# Patient Record
Sex: Female | Born: 1950 | Race: White | Hispanic: No | Smoking: Former smoker
Health system: Southern US, Community
[De-identification: ages and names within clinical notes are randomized; demographics above are authoritative.]

## PROBLEM LIST (undated history)

## (undated) DIAGNOSIS — R55 Syncope and collapse: Secondary | ICD-10-CM

## (undated) DIAGNOSIS — E785 Hyperlipidemia, unspecified: Secondary | ICD-10-CM

## (undated) DIAGNOSIS — E7800 Pure hypercholesterolemia, unspecified: Secondary | ICD-10-CM

## (undated) DIAGNOSIS — F419 Anxiety disorder, unspecified: Secondary | ICD-10-CM

## (undated) DIAGNOSIS — I10 Essential (primary) hypertension: Secondary | ICD-10-CM

## (undated) DIAGNOSIS — M1990 Unspecified osteoarthritis, unspecified site: Secondary | ICD-10-CM

## (undated) DIAGNOSIS — J189 Pneumonia, unspecified organism: Secondary | ICD-10-CM

## (undated) DIAGNOSIS — F32A Depression, unspecified: Secondary | ICD-10-CM

## (undated) DIAGNOSIS — H539 Unspecified visual disturbance: Secondary | ICD-10-CM

## (undated) DIAGNOSIS — G479 Sleep disorder, unspecified: Secondary | ICD-10-CM

## (undated) HISTORY — PX: RECTOCELE REPAIR: SHX761

## (undated) HISTORY — DX: Sleep disorder, unspecified: G47.9

## (undated) HISTORY — DX: Essential (primary) hypertension: I10

## (undated) HISTORY — PX: OTHER SURGICAL HISTORY: SHX169

## (undated) HISTORY — DX: Hyperlipidemia, unspecified: E78.5

## (undated) HISTORY — DX: Pure hypercholesterolemia, unspecified: E78.00

## (undated) HISTORY — PX: ABDOMINAL HYSTERECTOMY: SHX81

## (undated) HISTORY — PX: BONE EXCISION: SHX6730

## (undated) HISTORY — PX: BREAST BIOPSY: SHX20

## (undated) HISTORY — DX: Unspecified visual disturbance: H53.9

## (undated) HISTORY — DX: Syncope and collapse: R55

---

## 2016-02-03 DIAGNOSIS — Z780 Asymptomatic menopausal state: Secondary | ICD-10-CM | POA: Diagnosis not present

## 2016-02-03 DIAGNOSIS — M8580 Other specified disorders of bone density and structure, unspecified site: Secondary | ICD-10-CM | POA: Diagnosis not present

## 2016-02-03 DIAGNOSIS — Z800 Family history of malignant neoplasm of digestive organs: Secondary | ICD-10-CM | POA: Diagnosis not present

## 2016-02-03 DIAGNOSIS — Z86010 Personal history of colonic polyps: Secondary | ICD-10-CM | POA: Diagnosis not present

## 2016-02-03 DIAGNOSIS — Z23 Encounter for immunization: Secondary | ICD-10-CM | POA: Diagnosis not present

## 2016-02-03 DIAGNOSIS — I10 Essential (primary) hypertension: Secondary | ICD-10-CM | POA: Diagnosis not present

## 2016-04-20 DIAGNOSIS — R300 Dysuria: Secondary | ICD-10-CM | POA: Diagnosis not present

## 2016-06-12 DIAGNOSIS — Z86010 Personal history of colonic polyps: Secondary | ICD-10-CM | POA: Diagnosis not present

## 2016-06-12 DIAGNOSIS — Z6829 Body mass index (BMI) 29.0-29.9, adult: Secondary | ICD-10-CM | POA: Diagnosis not present

## 2016-06-12 DIAGNOSIS — Z23 Encounter for immunization: Secondary | ICD-10-CM | POA: Diagnosis not present

## 2016-06-12 DIAGNOSIS — Z1159 Encounter for screening for other viral diseases: Secondary | ICD-10-CM | POA: Diagnosis not present

## 2016-06-12 DIAGNOSIS — I10 Essential (primary) hypertension: Secondary | ICD-10-CM | POA: Diagnosis not present

## 2016-06-12 DIAGNOSIS — F439 Reaction to severe stress, unspecified: Secondary | ICD-10-CM | POA: Diagnosis not present

## 2016-06-12 DIAGNOSIS — Z800 Family history of malignant neoplasm of digestive organs: Secondary | ICD-10-CM | POA: Diagnosis not present

## 2016-06-12 DIAGNOSIS — Z0000 Encounter for general adult medical examination without abnormal findings: Secondary | ICD-10-CM | POA: Diagnosis not present

## 2016-06-12 DIAGNOSIS — E663 Overweight: Secondary | ICD-10-CM | POA: Diagnosis not present

## 2016-06-12 DIAGNOSIS — Z780 Asymptomatic menopausal state: Secondary | ICD-10-CM | POA: Diagnosis not present

## 2016-06-17 ENCOUNTER — Other Ambulatory Visit: Payer: Self-pay | Admitting: Family Medicine

## 2016-06-17 DIAGNOSIS — Z803 Family history of malignant neoplasm of breast: Secondary | ICD-10-CM

## 2016-06-17 DIAGNOSIS — Z1231 Encounter for screening mammogram for malignant neoplasm of breast: Secondary | ICD-10-CM

## 2016-06-23 DIAGNOSIS — R899 Unspecified abnormal finding in specimens from other organs, systems and tissues: Secondary | ICD-10-CM | POA: Diagnosis not present

## 2016-06-23 DIAGNOSIS — I10 Essential (primary) hypertension: Secondary | ICD-10-CM | POA: Diagnosis not present

## 2016-08-11 ENCOUNTER — Ambulatory Visit: Payer: Self-pay

## 2016-08-21 DIAGNOSIS — J069 Acute upper respiratory infection, unspecified: Secondary | ICD-10-CM | POA: Diagnosis not present

## 2016-08-21 DIAGNOSIS — R05 Cough: Secondary | ICD-10-CM | POA: Diagnosis not present

## 2016-08-31 DIAGNOSIS — J189 Pneumonia, unspecified organism: Secondary | ICD-10-CM | POA: Diagnosis not present

## 2016-09-14 DIAGNOSIS — J189 Pneumonia, unspecified organism: Secondary | ICD-10-CM | POA: Diagnosis not present

## 2016-09-14 DIAGNOSIS — R899 Unspecified abnormal finding in specimens from other organs, systems and tissues: Secondary | ICD-10-CM | POA: Diagnosis not present

## 2016-09-14 DIAGNOSIS — E7800 Pure hypercholesterolemia, unspecified: Secondary | ICD-10-CM | POA: Diagnosis not present

## 2016-09-15 ENCOUNTER — Encounter: Payer: Self-pay | Admitting: Radiology

## 2016-09-15 ENCOUNTER — Ambulatory Visit
Admission: RE | Admit: 2016-09-15 | Discharge: 2016-09-15 | Disposition: A | Discharge status: Home / Self Care | Payer: PRIVATE HEALTH INSURANCE | Source: Ambulatory Visit | Attending: Family Medicine | Admitting: Family Medicine

## 2016-09-15 DIAGNOSIS — Z1231 Encounter for screening mammogram for malignant neoplasm of breast: Secondary | ICD-10-CM

## 2016-09-15 DIAGNOSIS — Z803 Family history of malignant neoplasm of breast: Secondary | ICD-10-CM

## 2016-09-16 ENCOUNTER — Other Ambulatory Visit: Payer: Self-pay | Admitting: Family Medicine

## 2016-09-16 DIAGNOSIS — R928 Other abnormal and inconclusive findings on diagnostic imaging of breast: Secondary | ICD-10-CM

## 2016-09-22 ENCOUNTER — Other Ambulatory Visit: Payer: Self-pay | Admitting: Family Medicine

## 2016-09-22 ENCOUNTER — Ambulatory Visit
Admission: RE | Admit: 2016-09-22 | Discharge: 2016-09-22 | Disposition: A | Discharge status: Home / Self Care | Payer: PRIVATE HEALTH INSURANCE | Source: Ambulatory Visit | Attending: Family Medicine | Admitting: Family Medicine

## 2016-09-22 DIAGNOSIS — R928 Other abnormal and inconclusive findings on diagnostic imaging of breast: Secondary | ICD-10-CM | POA: Diagnosis not present

## 2016-09-22 DIAGNOSIS — N6310 Unspecified lump in the right breast, unspecified quadrant: Secondary | ICD-10-CM | POA: Diagnosis not present

## 2016-09-24 ENCOUNTER — Other Ambulatory Visit: Payer: Self-pay | Admitting: Family Medicine

## 2016-09-24 DIAGNOSIS — R928 Other abnormal and inconclusive findings on diagnostic imaging of breast: Secondary | ICD-10-CM

## 2016-09-25 ENCOUNTER — Ambulatory Visit
Admission: RE | Admit: 2016-09-25 | Discharge: 2016-09-25 | Disposition: A | Discharge status: Home / Self Care | Payer: Medicare Other | Source: Ambulatory Visit | Attending: Family Medicine | Admitting: Family Medicine

## 2016-09-25 DIAGNOSIS — R590 Localized enlarged lymph nodes: Secondary | ICD-10-CM | POA: Diagnosis not present

## 2016-09-25 DIAGNOSIS — R928 Other abnormal and inconclusive findings on diagnostic imaging of breast: Secondary | ICD-10-CM

## 2016-09-25 DIAGNOSIS — N6311 Unspecified lump in the right breast, upper outer quadrant: Secondary | ICD-10-CM | POA: Diagnosis not present

## 2016-09-25 DIAGNOSIS — N6313 Unspecified lump in the right breast, lower outer quadrant: Secondary | ICD-10-CM | POA: Diagnosis not present

## 2016-09-25 DIAGNOSIS — N6310 Unspecified lump in the right breast, unspecified quadrant: Secondary | ICD-10-CM | POA: Diagnosis not present

## 2016-09-25 HISTORY — PX: BREAST BIOPSY: SHX20

## 2016-12-07 DIAGNOSIS — R05 Cough: Secondary | ICD-10-CM | POA: Diagnosis not present

## 2017-03-02 DIAGNOSIS — N951 Menopausal and female climacteric states: Secondary | ICD-10-CM | POA: Diagnosis not present

## 2017-03-02 DIAGNOSIS — R635 Abnormal weight gain: Secondary | ICD-10-CM | POA: Diagnosis not present

## 2017-03-04 DIAGNOSIS — E782 Mixed hyperlipidemia: Secondary | ICD-10-CM | POA: Diagnosis not present

## 2017-03-04 DIAGNOSIS — I10 Essential (primary) hypertension: Secondary | ICD-10-CM | POA: Diagnosis not present

## 2017-03-04 DIAGNOSIS — R5383 Other fatigue: Secondary | ICD-10-CM | POA: Diagnosis not present

## 2017-03-04 DIAGNOSIS — Z6830 Body mass index (BMI) 30.0-30.9, adult: Secondary | ICD-10-CM | POA: Diagnosis not present

## 2017-03-04 DIAGNOSIS — N958 Other specified menopausal and perimenopausal disorders: Secondary | ICD-10-CM | POA: Diagnosis not present

## 2017-03-09 DIAGNOSIS — I10 Essential (primary) hypertension: Secondary | ICD-10-CM | POA: Diagnosis not present

## 2017-03-09 DIAGNOSIS — E782 Mixed hyperlipidemia: Secondary | ICD-10-CM | POA: Diagnosis not present

## 2017-03-09 DIAGNOSIS — Z6830 Body mass index (BMI) 30.0-30.9, adult: Secondary | ICD-10-CM | POA: Diagnosis not present

## 2017-03-16 DIAGNOSIS — I10 Essential (primary) hypertension: Secondary | ICD-10-CM | POA: Diagnosis not present

## 2017-03-16 DIAGNOSIS — Z6830 Body mass index (BMI) 30.0-30.9, adult: Secondary | ICD-10-CM | POA: Diagnosis not present

## 2017-03-16 DIAGNOSIS — E782 Mixed hyperlipidemia: Secondary | ICD-10-CM | POA: Diagnosis not present

## 2017-03-23 DIAGNOSIS — Z6829 Body mass index (BMI) 29.0-29.9, adult: Secondary | ICD-10-CM | POA: Diagnosis not present

## 2017-03-23 DIAGNOSIS — E782 Mixed hyperlipidemia: Secondary | ICD-10-CM | POA: Diagnosis not present

## 2017-03-23 DIAGNOSIS — I10 Essential (primary) hypertension: Secondary | ICD-10-CM | POA: Diagnosis not present

## 2017-03-27 DIAGNOSIS — H2513 Age-related nuclear cataract, bilateral: Secondary | ICD-10-CM | POA: Diagnosis not present

## 2017-04-01 DIAGNOSIS — N958 Other specified menopausal and perimenopausal disorders: Secondary | ICD-10-CM | POA: Diagnosis not present

## 2017-04-01 DIAGNOSIS — I10 Essential (primary) hypertension: Secondary | ICD-10-CM | POA: Diagnosis not present

## 2017-04-01 DIAGNOSIS — Z6828 Body mass index (BMI) 28.0-28.9, adult: Secondary | ICD-10-CM | POA: Diagnosis not present

## 2017-04-01 DIAGNOSIS — E782 Mixed hyperlipidemia: Secondary | ICD-10-CM | POA: Diagnosis not present

## 2017-04-08 DIAGNOSIS — E663 Overweight: Secondary | ICD-10-CM | POA: Diagnosis not present

## 2017-04-08 DIAGNOSIS — Z6828 Body mass index (BMI) 28.0-28.9, adult: Secondary | ICD-10-CM | POA: Diagnosis not present

## 2017-04-29 DIAGNOSIS — I10 Essential (primary) hypertension: Secondary | ICD-10-CM | POA: Diagnosis not present

## 2017-04-29 DIAGNOSIS — E782 Mixed hyperlipidemia: Secondary | ICD-10-CM | POA: Diagnosis not present

## 2017-04-29 DIAGNOSIS — Z6829 Body mass index (BMI) 29.0-29.9, adult: Secondary | ICD-10-CM | POA: Diagnosis not present

## 2017-05-18 DIAGNOSIS — I10 Essential (primary) hypertension: Secondary | ICD-10-CM | POA: Diagnosis not present

## 2017-05-18 DIAGNOSIS — Z6827 Body mass index (BMI) 27.0-27.9, adult: Secondary | ICD-10-CM | POA: Diagnosis not present

## 2017-05-18 DIAGNOSIS — E782 Mixed hyperlipidemia: Secondary | ICD-10-CM | POA: Diagnosis not present

## 2017-05-27 DIAGNOSIS — I10 Essential (primary) hypertension: Secondary | ICD-10-CM | POA: Diagnosis not present

## 2017-05-27 DIAGNOSIS — E782 Mixed hyperlipidemia: Secondary | ICD-10-CM | POA: Diagnosis not present

## 2017-05-27 DIAGNOSIS — Z6827 Body mass index (BMI) 27.0-27.9, adult: Secondary | ICD-10-CM | POA: Diagnosis not present

## 2017-06-15 DIAGNOSIS — M180 Bilateral primary osteoarthritis of first carpometacarpal joints: Secondary | ICD-10-CM | POA: Diagnosis not present

## 2017-06-15 DIAGNOSIS — K146 Glossodynia: Secondary | ICD-10-CM | POA: Diagnosis not present

## 2017-06-15 DIAGNOSIS — Z0000 Encounter for general adult medical examination without abnormal findings: Secondary | ICD-10-CM | POA: Diagnosis not present

## 2017-06-15 DIAGNOSIS — E7800 Pure hypercholesterolemia, unspecified: Secondary | ICD-10-CM | POA: Diagnosis not present

## 2017-06-15 DIAGNOSIS — E663 Overweight: Secondary | ICD-10-CM | POA: Diagnosis not present

## 2017-06-15 DIAGNOSIS — Z86010 Personal history of colonic polyps: Secondary | ICD-10-CM | POA: Diagnosis not present

## 2017-06-15 DIAGNOSIS — Z6827 Body mass index (BMI) 27.0-27.9, adult: Secondary | ICD-10-CM | POA: Diagnosis not present

## 2017-06-15 DIAGNOSIS — I10 Essential (primary) hypertension: Secondary | ICD-10-CM | POA: Diagnosis not present

## 2017-06-15 DIAGNOSIS — Z800 Family history of malignant neoplasm of digestive organs: Secondary | ICD-10-CM | POA: Diagnosis not present

## 2017-06-15 DIAGNOSIS — R5383 Other fatigue: Secondary | ICD-10-CM | POA: Diagnosis not present

## 2017-06-15 DIAGNOSIS — R899 Unspecified abnormal finding in specimens from other organs, systems and tissues: Secondary | ICD-10-CM | POA: Diagnosis not present

## 2017-06-15 DIAGNOSIS — Z1389 Encounter for screening for other disorder: Secondary | ICD-10-CM | POA: Diagnosis not present

## 2017-06-22 DIAGNOSIS — N958 Other specified menopausal and perimenopausal disorders: Secondary | ICD-10-CM | POA: Diagnosis not present

## 2017-06-22 DIAGNOSIS — Z6826 Body mass index (BMI) 26.0-26.9, adult: Secondary | ICD-10-CM | POA: Diagnosis not present

## 2017-06-22 DIAGNOSIS — E782 Mixed hyperlipidemia: Secondary | ICD-10-CM | POA: Diagnosis not present

## 2017-06-22 DIAGNOSIS — I10 Essential (primary) hypertension: Secondary | ICD-10-CM | POA: Diagnosis not present

## 2017-07-09 DIAGNOSIS — M1811 Unilateral primary osteoarthritis of first carpometacarpal joint, right hand: Secondary | ICD-10-CM | POA: Diagnosis not present

## 2017-07-09 DIAGNOSIS — M1812 Unilateral primary osteoarthritis of first carpometacarpal joint, left hand: Secondary | ICD-10-CM | POA: Diagnosis not present

## 2017-08-02 DIAGNOSIS — M1812 Unilateral primary osteoarthritis of first carpometacarpal joint, left hand: Secondary | ICD-10-CM | POA: Diagnosis not present

## 2017-08-02 DIAGNOSIS — G8918 Other acute postprocedural pain: Secondary | ICD-10-CM | POA: Diagnosis not present

## 2017-08-16 ENCOUNTER — Other Ambulatory Visit: Payer: Self-pay | Admitting: Family Medicine

## 2017-08-16 DIAGNOSIS — Z1231 Encounter for screening mammogram for malignant neoplasm of breast: Secondary | ICD-10-CM

## 2017-08-17 DIAGNOSIS — M1812 Unilateral primary osteoarthritis of first carpometacarpal joint, left hand: Secondary | ICD-10-CM | POA: Diagnosis not present

## 2017-08-27 DIAGNOSIS — M1812 Unilateral primary osteoarthritis of first carpometacarpal joint, left hand: Secondary | ICD-10-CM | POA: Diagnosis not present

## 2017-09-14 DIAGNOSIS — M1812 Unilateral primary osteoarthritis of first carpometacarpal joint, left hand: Secondary | ICD-10-CM | POA: Diagnosis not present

## 2017-09-17 DIAGNOSIS — M1812 Unilateral primary osteoarthritis of first carpometacarpal joint, left hand: Secondary | ICD-10-CM | POA: Diagnosis not present

## 2017-09-22 DIAGNOSIS — M1812 Unilateral primary osteoarthritis of first carpometacarpal joint, left hand: Secondary | ICD-10-CM | POA: Diagnosis not present

## 2017-09-28 ENCOUNTER — Ambulatory Visit
Admission: RE | Admit: 2017-09-28 | Discharge: 2017-09-28 | Disposition: A | Discharge status: Home / Self Care | Payer: Medicare Other | Source: Ambulatory Visit | Attending: Family Medicine | Admitting: Family Medicine

## 2017-09-28 DIAGNOSIS — Z1231 Encounter for screening mammogram for malignant neoplasm of breast: Secondary | ICD-10-CM | POA: Diagnosis not present

## 2017-09-29 DIAGNOSIS — M1812 Unilateral primary osteoarthritis of first carpometacarpal joint, left hand: Secondary | ICD-10-CM | POA: Diagnosis not present

## 2017-10-01 DIAGNOSIS — M1812 Unilateral primary osteoarthritis of first carpometacarpal joint, left hand: Secondary | ICD-10-CM | POA: Diagnosis not present

## 2017-10-06 DIAGNOSIS — M1812 Unilateral primary osteoarthritis of first carpometacarpal joint, left hand: Secondary | ICD-10-CM | POA: Diagnosis not present

## 2017-10-08 DIAGNOSIS — M1812 Unilateral primary osteoarthritis of first carpometacarpal joint, left hand: Secondary | ICD-10-CM | POA: Diagnosis not present

## 2017-10-11 DIAGNOSIS — M1812 Unilateral primary osteoarthritis of first carpometacarpal joint, left hand: Secondary | ICD-10-CM | POA: Diagnosis not present

## 2017-10-12 DIAGNOSIS — R55 Syncope and collapse: Secondary | ICD-10-CM

## 2017-10-12 HISTORY — DX: Syncope and collapse: R55

## 2017-10-13 DIAGNOSIS — M79645 Pain in left finger(s): Secondary | ICD-10-CM | POA: Diagnosis not present

## 2017-10-19 DIAGNOSIS — M79645 Pain in left finger(s): Secondary | ICD-10-CM | POA: Diagnosis not present

## 2017-10-21 DIAGNOSIS — M79645 Pain in left finger(s): Secondary | ICD-10-CM | POA: Diagnosis not present

## 2017-10-26 DIAGNOSIS — N951 Menopausal and female climacteric states: Secondary | ICD-10-CM | POA: Diagnosis not present

## 2017-10-26 DIAGNOSIS — R350 Frequency of micturition: Secondary | ICD-10-CM | POA: Diagnosis not present

## 2017-10-26 DIAGNOSIS — Z4789 Encounter for other orthopedic aftercare: Secondary | ICD-10-CM | POA: Diagnosis not present

## 2017-10-26 DIAGNOSIS — M13842 Other specified arthritis, left hand: Secondary | ICD-10-CM | POA: Diagnosis not present

## 2017-10-26 DIAGNOSIS — M79645 Pain in left finger(s): Secondary | ICD-10-CM | POA: Diagnosis not present

## 2017-10-28 DIAGNOSIS — M79645 Pain in left finger(s): Secondary | ICD-10-CM | POA: Diagnosis not present

## 2017-11-02 DIAGNOSIS — M79645 Pain in left finger(s): Secondary | ICD-10-CM | POA: Diagnosis not present

## 2017-11-04 DIAGNOSIS — M79645 Pain in left finger(s): Secondary | ICD-10-CM | POA: Diagnosis not present

## 2017-11-17 DIAGNOSIS — N951 Menopausal and female climacteric states: Secondary | ICD-10-CM | POA: Diagnosis not present

## 2018-02-01 DIAGNOSIS — M13842 Other specified arthritis, left hand: Secondary | ICD-10-CM | POA: Diagnosis not present

## 2018-02-01 DIAGNOSIS — M13841 Other specified arthritis, right hand: Secondary | ICD-10-CM | POA: Diagnosis not present

## 2018-02-01 DIAGNOSIS — M79645 Pain in left finger(s): Secondary | ICD-10-CM | POA: Diagnosis not present

## 2018-04-04 DIAGNOSIS — G8918 Other acute postprocedural pain: Secondary | ICD-10-CM | POA: Diagnosis not present

## 2018-04-04 DIAGNOSIS — M1811 Unilateral primary osteoarthritis of first carpometacarpal joint, right hand: Secondary | ICD-10-CM | POA: Diagnosis not present

## 2018-04-18 DIAGNOSIS — Z4789 Encounter for other orthopedic aftercare: Secondary | ICD-10-CM | POA: Diagnosis not present

## 2018-04-18 DIAGNOSIS — M13841 Other specified arthritis, right hand: Secondary | ICD-10-CM | POA: Diagnosis not present

## 2018-05-04 DIAGNOSIS — H2513 Age-related nuclear cataract, bilateral: Secondary | ICD-10-CM | POA: Diagnosis not present

## 2018-05-13 DIAGNOSIS — N390 Urinary tract infection, site not specified: Secondary | ICD-10-CM | POA: Diagnosis not present

## 2018-05-19 DIAGNOSIS — Z4789 Encounter for other orthopedic aftercare: Secondary | ICD-10-CM | POA: Diagnosis not present

## 2018-05-19 DIAGNOSIS — M13841 Other specified arthritis, right hand: Secondary | ICD-10-CM | POA: Diagnosis not present

## 2018-06-02 DIAGNOSIS — M79644 Pain in right finger(s): Secondary | ICD-10-CM | POA: Diagnosis not present

## 2018-06-09 DIAGNOSIS — M79644 Pain in right finger(s): Secondary | ICD-10-CM | POA: Diagnosis not present

## 2018-06-16 DIAGNOSIS — M13841 Other specified arthritis, right hand: Secondary | ICD-10-CM | POA: Diagnosis not present

## 2018-06-16 DIAGNOSIS — Z4789 Encounter for other orthopedic aftercare: Secondary | ICD-10-CM | POA: Diagnosis not present

## 2018-06-21 DIAGNOSIS — M79644 Pain in right finger(s): Secondary | ICD-10-CM | POA: Diagnosis not present

## 2018-06-28 ENCOUNTER — Other Ambulatory Visit: Payer: Self-pay | Admitting: Family Medicine

## 2018-06-28 DIAGNOSIS — E7800 Pure hypercholesterolemia, unspecified: Secondary | ICD-10-CM | POA: Diagnosis not present

## 2018-06-28 DIAGNOSIS — J302 Other seasonal allergic rhinitis: Secondary | ICD-10-CM | POA: Diagnosis not present

## 2018-06-28 DIAGNOSIS — I10 Essential (primary) hypertension: Secondary | ICD-10-CM | POA: Diagnosis not present

## 2018-06-28 DIAGNOSIS — Z800 Family history of malignant neoplasm of digestive organs: Secondary | ICD-10-CM | POA: Diagnosis not present

## 2018-06-28 DIAGNOSIS — N951 Menopausal and female climacteric states: Secondary | ICD-10-CM | POA: Diagnosis not present

## 2018-06-28 DIAGNOSIS — Z1231 Encounter for screening mammogram for malignant neoplasm of breast: Secondary | ICD-10-CM

## 2018-06-28 DIAGNOSIS — Z780 Asymptomatic menopausal state: Secondary | ICD-10-CM | POA: Diagnosis not present

## 2018-06-28 DIAGNOSIS — Z1283 Encounter for screening for malignant neoplasm of skin: Secondary | ICD-10-CM | POA: Diagnosis not present

## 2018-06-28 DIAGNOSIS — F439 Reaction to severe stress, unspecified: Secondary | ICD-10-CM | POA: Diagnosis not present

## 2018-06-28 DIAGNOSIS — Z0000 Encounter for general adult medical examination without abnormal findings: Secondary | ICD-10-CM | POA: Diagnosis not present

## 2018-07-07 DIAGNOSIS — M79644 Pain in right finger(s): Secondary | ICD-10-CM | POA: Diagnosis not present

## 2018-07-18 DIAGNOSIS — M21622 Bunionette of left foot: Secondary | ICD-10-CM | POA: Diagnosis not present

## 2018-07-18 DIAGNOSIS — M79671 Pain in right foot: Secondary | ICD-10-CM | POA: Diagnosis not present

## 2018-07-18 DIAGNOSIS — M79672 Pain in left foot: Secondary | ICD-10-CM | POA: Diagnosis not present

## 2018-07-18 DIAGNOSIS — M21621 Bunionette of right foot: Secondary | ICD-10-CM | POA: Diagnosis not present

## 2018-07-28 ENCOUNTER — Other Ambulatory Visit: Payer: Self-pay | Admitting: Family Medicine

## 2018-07-28 DIAGNOSIS — M8580 Other specified disorders of bone density and structure, unspecified site: Secondary | ICD-10-CM

## 2018-08-12 ENCOUNTER — Ambulatory Visit
Admission: RE | Admit: 2018-08-12 | Discharge: 2018-08-12 | Disposition: A | Discharge status: Home / Self Care | Payer: Medicare Other | Source: Ambulatory Visit | Attending: Family Medicine | Admitting: Family Medicine

## 2018-08-12 DIAGNOSIS — M85851 Other specified disorders of bone density and structure, right thigh: Secondary | ICD-10-CM | POA: Diagnosis not present

## 2018-08-12 DIAGNOSIS — M8580 Other specified disorders of bone density and structure, unspecified site: Secondary | ICD-10-CM

## 2018-08-12 DIAGNOSIS — Z780 Asymptomatic menopausal state: Secondary | ICD-10-CM | POA: Diagnosis not present

## 2018-08-25 DIAGNOSIS — I10 Essential (primary) hypertension: Secondary | ICD-10-CM | POA: Diagnosis not present

## 2018-08-25 DIAGNOSIS — R42 Dizziness and giddiness: Secondary | ICD-10-CM | POA: Diagnosis not present

## 2018-08-31 DIAGNOSIS — R9431 Abnormal electrocardiogram [ECG] [EKG]: Secondary | ICD-10-CM | POA: Diagnosis not present

## 2018-08-31 DIAGNOSIS — G479 Sleep disorder, unspecified: Secondary | ICD-10-CM | POA: Diagnosis not present

## 2018-08-31 DIAGNOSIS — I10 Essential (primary) hypertension: Secondary | ICD-10-CM | POA: Diagnosis not present

## 2018-08-31 DIAGNOSIS — R55 Syncope and collapse: Secondary | ICD-10-CM | POA: Diagnosis not present

## 2018-09-15 DIAGNOSIS — H6692 Otitis media, unspecified, left ear: Secondary | ICD-10-CM | POA: Diagnosis not present

## 2018-09-27 DIAGNOSIS — R42 Dizziness and giddiness: Secondary | ICD-10-CM | POA: Diagnosis not present

## 2018-09-27 DIAGNOSIS — R55 Syncope and collapse: Secondary | ICD-10-CM | POA: Diagnosis not present

## 2018-09-27 DIAGNOSIS — R0989 Other specified symptoms and signs involving the circulatory and respiratory systems: Secondary | ICD-10-CM | POA: Diagnosis not present

## 2018-09-27 DIAGNOSIS — I10 Essential (primary) hypertension: Secondary | ICD-10-CM | POA: Diagnosis not present

## 2018-09-30 ENCOUNTER — Ambulatory Visit
Admission: RE | Admit: 2018-09-30 | Discharge: 2018-09-30 | Disposition: A | Discharge status: Home / Self Care | Payer: Medicare Other | Source: Ambulatory Visit | Attending: Family Medicine | Admitting: Family Medicine

## 2018-09-30 DIAGNOSIS — Z1231 Encounter for screening mammogram for malignant neoplasm of breast: Secondary | ICD-10-CM | POA: Diagnosis not present

## 2018-10-03 DIAGNOSIS — R42 Dizziness and giddiness: Secondary | ICD-10-CM | POA: Diagnosis not present

## 2018-10-03 DIAGNOSIS — R55 Syncope and collapse: Secondary | ICD-10-CM | POA: Diagnosis not present

## 2018-10-03 DIAGNOSIS — I10 Essential (primary) hypertension: Secondary | ICD-10-CM | POA: Diagnosis not present

## 2018-10-06 DIAGNOSIS — G479 Sleep disorder, unspecified: Secondary | ICD-10-CM | POA: Diagnosis not present

## 2018-10-06 DIAGNOSIS — I10 Essential (primary) hypertension: Secondary | ICD-10-CM | POA: Diagnosis not present

## 2018-10-06 DIAGNOSIS — R55 Syncope and collapse: Secondary | ICD-10-CM | POA: Diagnosis not present

## 2018-10-06 DIAGNOSIS — R9431 Abnormal electrocardiogram [ECG] [EKG]: Secondary | ICD-10-CM | POA: Diagnosis not present

## 2018-11-21 DIAGNOSIS — E7800 Pure hypercholesterolemia, unspecified: Secondary | ICD-10-CM | POA: Diagnosis not present

## 2018-11-21 DIAGNOSIS — N951 Menopausal and female climacteric states: Secondary | ICD-10-CM | POA: Diagnosis not present

## 2018-11-21 DIAGNOSIS — I10 Essential (primary) hypertension: Secondary | ICD-10-CM | POA: Diagnosis not present

## 2018-11-21 DIAGNOSIS — Z6832 Body mass index (BMI) 32.0-32.9, adult: Secondary | ICD-10-CM | POA: Diagnosis not present

## 2018-12-06 DIAGNOSIS — L821 Other seborrheic keratosis: Secondary | ICD-10-CM | POA: Diagnosis not present

## 2018-12-06 DIAGNOSIS — D2261 Melanocytic nevi of right upper limb, including shoulder: Secondary | ICD-10-CM | POA: Diagnosis not present

## 2018-12-06 DIAGNOSIS — L570 Actinic keratosis: Secondary | ICD-10-CM | POA: Diagnosis not present

## 2018-12-06 DIAGNOSIS — D225 Melanocytic nevi of trunk: Secondary | ICD-10-CM | POA: Diagnosis not present

## 2018-12-06 DIAGNOSIS — D2239 Melanocytic nevi of other parts of face: Secondary | ICD-10-CM | POA: Diagnosis not present

## 2018-12-06 DIAGNOSIS — D2262 Melanocytic nevi of left upper limb, including shoulder: Secondary | ICD-10-CM | POA: Diagnosis not present

## 2018-12-06 DIAGNOSIS — L720 Epidermal cyst: Secondary | ICD-10-CM | POA: Diagnosis not present

## 2018-12-08 ENCOUNTER — Encounter: Payer: Self-pay | Admitting: Neurology

## 2018-12-12 ENCOUNTER — Other Ambulatory Visit: Payer: Self-pay

## 2018-12-12 ENCOUNTER — Ambulatory Visit (INDEPENDENT_AMBULATORY_CARE_PROVIDER_SITE_OTHER): Payer: PPO | Admitting: Neurology

## 2018-12-12 ENCOUNTER — Encounter: Payer: Self-pay | Admitting: Neurology

## 2018-12-12 VITALS — BP 136/81 | HR 94 | Resp 16 | Ht 63.0 in | Wt 180.0 lb

## 2018-12-12 DIAGNOSIS — G478 Other sleep disorders: Secondary | ICD-10-CM

## 2018-12-12 DIAGNOSIS — R0683 Snoring: Secondary | ICD-10-CM

## 2018-12-12 DIAGNOSIS — R351 Nocturia: Secondary | ICD-10-CM | POA: Diagnosis not present

## 2018-12-12 DIAGNOSIS — N951 Menopausal and female climacteric states: Secondary | ICD-10-CM | POA: Diagnosis not present

## 2018-12-12 DIAGNOSIS — G4719 Other hypersomnia: Secondary | ICD-10-CM | POA: Diagnosis not present

## 2018-12-12 NOTE — Progress Notes (Addendum)
SLEEP MEDICINE CLINIC   Provider:  Larey Seat, MD   Primary Care Physician:  Kathyrn Lass, MD   Referring Provider: Dr Einar Gip, MD   Chief Complaint  Patient presents with  . Sleep Disturbance    Rm. 10. Kristy Henry is here to discuss a sleep disturbance. 36lb wt. gain since October 2019. Goes to bed at 10pm, tosses and turns--sometimes it takes hours to fall asleep. Up mult. times during the night to go to the bathroom. Usually falls back to sleep easily. Wakes between 6am-6:30am. C/O EDS. Usually naps around noon for around an hour./fim    HPI:   Kristy Henry is a 68 y.o. female patient of Dr. Irven Shelling. She is,seen on 12-12-2018 upon referral by her cardiologist.  The patient is a 68 year old Caucasian right-handed female that has seen Dr. Einar Gip in follow-up for dizziness.  She is treated for hypertension which is well controlled on 12.5 mg of HCTZ , and she had an episode of fainting in late November 2019.  She states she woke up in the middle of the night had severe back pain and try to get some ibuprofen and drink some water but on the way to the bathroom felt faint finally seemed to have passed out and fell to the ground.  There were no injuries related to the fall she thinks that she was out after her head hit the floor and she can remember some parents of the beginning of her fainting spell.  She also complains now about the sudden onset of uncontrollable need for sleep and irresistible urge to nap around 11 AM.  This has been going on already prior to the syncope and fall.  She has not had any further syncope.  She also complains of menopausal symptoms getting sweats feeling hot in the middle of the night.  When she originally went through menopause she was placed on estradiol which controlled his symptoms very well, but now her PCP had recommended against it due to age, increased risk of blood clots, breast cancer risk etc. she was changed from estradiol to a generic form of Effexor and  she now has menopausal symptoms again.    Chief complaint according to patient :  Sleep habits are as follows: Dinnertime is usually without alcohol, at 5 PM-bedtime is at 10 PM, the bedroom is cool, quiet and dark. She wears a sleep mask, her cat is not in the bedroom during the night. She stops the night on her back, in supine sleep position.  She will turn however during the night.  Uses 1 pillow- memory foam. She wakes every night at different times, often with hot flushes.  nocturia 3 times.  She is usually up spontaneously waking person and by 6.20 she is ready to start today.  This means that she gets on average between 6 and 7 hours of sleep unless woken up more than once.  Medical history:  Bladder tuck, 3 pregnancies- HTN, syncope, hit her head.  Family history: sister with breast cancer.   Social history: Widowed, retired at 22. Her  oldest of 3 sons is 20, 77 and 61 years old, lost her husband suddenly when he was 71, and she was 60. 3 grandchildren. Former ( remote) smoker, non ETOH, no longer consuming alcohol.  Lives alone, caffeine - coffee 2 cups every morning.  Limited income.   Review of Systems: Out of a complete 14 system review, the patient complains of only the following symptoms, and all other reviewed systems  are negative.  Snoring, nocturia 3 times.   Epworth Sleepiness score: 14/ 24 point  , Fatigue severity score:  ,28/ 63 point   Depression score: 3/ 15 points.   Kristy Henry underwent several testings with her cardiologist syncopal and collapse have been worked up in detail, she had normal hemodynamic responses on a treadmill exercise test by Bruce protocol.  Overall quality of the study was good, a stress test and resting SPECT images demonstrated homogenous tracer distribution normal myocardial thickening with normal wall motion.  Left ventricular ejection fraction was calculated 46% but visually appeared normal.  Primary sleep disturbance this  work-up will take place here, essential hypertension with echocardiogram from 09-27-18 normal left ventricular cavity size, moderate concentric hypertrophy of the left ventricle calculated EF now 64% much more more normal looking.  Trace pulmonic regurgitation trace of mitral regurgitation.  Carotid artery duplex from 09/27/2018 minimal stenosis of the right internal carotid artery no stenosis on the left, antegrade left vertebral flow.   Normal sinus rhythm on EKG at  77 bpm, normal axis on 31 August 2018.  The patient also had frequent PVCs.   Social History   Socioeconomic History  . Marital status: Widowed    Spouse name: Not on file  . Number of children: 3  . Years of education: Not on file  . Highest education level: High school graduate  Occupational History  . Occupation: Retired Research scientist (physical sciences)    Comment: [employer]  . Financial resource strain: Not on file  . Food insecurity:    Worry: Not on file    Inability: Not on file  . Transportation needs:    Medical: Not on file    Non-medical: Not on file  Tobacco Use  . Smoking status: Former Research scientist (life sciences)  . Smokeless tobacco: Never Used  Substance and Sexual Activity  . Alcohol use: Yes    Comment: rare glass of wine  . Drug use: Never  . Sexual activity: Not on file  Lifestyle  . Physical activity:    Days per week: Not on file    Minutes per session: Not on file  . Stress: Not on file  Relationships  . Social connections:    Talks on phone: Not on file    Gets together: Not on file    Attends religious service: Not on file    Active member of club or organization: Not on file    Attends meetings of clubs or organizations: Not on file    Relationship status: Not on file  . Intimate partner violence:    Fear of current or ex partner: Not on file    Emotionally abused: Not on file    Physically abused: Not on file    Forced sexual activity: Not on file  Other Topics Concern  . Not on file  Social  History Narrative  . Not on file    Family History  Problem Relation Age of Onset  . Breast cancer Sister   . Heart disease Mother   . Cancer Father   . Colon cancer Father   . Diabetes type II Brother   . High blood pressure Sister   . Diabetes type II Brother     Past Medical History:  Diagnosis Date  . High cholesterol   . HLD (hyperlipidemia)   . Hypertension   . Primary sleep disturbance   . Syncope and collapse   . Vision abnormalities     Past Surgical History:  Procedure Laterality Date  . ABDOMINAL HYSTERECTOMY    . BONE EXCISION     from bilateral hands  . BREAST BIOPSY    . eye lid surgery    . RECTOCELE REPAIR      Current Outpatient Medications  Medication Sig Dispense Refill  . atorvastatin (LIPITOR) 10 MG tablet atorvastatin 10 mg tablet    . Collagen Hydrolysate POWD by Does not apply route.    . Ferrous Sulfate (IRON) 325 (65 Fe) MG TABS Take 1 tablet by mouth 2 (two) times a week.    . hydrochlorothiazide (MICROZIDE) 12.5 MG capsule Take 12.5 mg by mouth daily.    . Turmeric 500 MG CAPS Take by mouth.    . venlafaxine XR (EFFEXOR-XR) 75 MG 24 hr capsule venlafaxine ER 75 mg capsule,extended release 24 hr     No current facility-administered medications for this visit.     Allergies as of 12/12/2018 - Review Complete 12/12/2018  Allergen Reaction Noted  . Hydrocodone  12/08/2018  . Oxycodone  12/08/2018    Vitals: BP 136/81   Pulse 94   Resp 16   Ht 5\' 3"  (1.6 m)   Wt 180 lb (81.6 kg)   BMI 31.89 kg/m  Last Weight:  Wt Readings from Last 1 Encounters:  12/12/18 180 lb (81.6 kg)   XNA:TFTD mass index is 31.89 kg/m.     Last Height:   Ht Readings from Last 1 Encounters:  12/12/18 5\' 3"  (1.6 m)    Physical exam:  General: The patient is awake, alert and appears not in acute distress. The patient is well groomed. Head: Normocephalic, atraumatic. Neck is supple. Mallampati 2 neck circumference: 15".  Nasal airflow patent-   Retrognathia is not seen.  Cardiovascular:  Regular rate and rhythm, without  murmurs or carotid bruit, and without distended neck veins. Respiratory: Lungs are clear to auscultation. Skin:  Without evidence of edema, or rash Trunk: BMI is 32. The patient's posture is erect.  Neurologic exam : The patient is awake and alert, oriented to place and time.   Attention span & concentration ability appears normal.  Speech is fluent,  without dysarthria, dysphonia or aphasia.  Mood and affect are appropriate.  Cranial nerves: Pupils are equal and briskly reactive to light. Funduscopic exam without evidence of pallor or edema. Extraocular movements  in vertical and horizontal planes intact and without nystagmus. Visual fields by finger perimetry are intact. Hearing to finger rub intact.   Facial sensation intact to fine touch.  Facial motor strength is symmetric and tongue and uvula move midline. Shoulder shrug was symmetrical.  Motor exam: Normal tone, muscle bulk and symmetric strength in all extremities. Sensory:  Fine touch, pinprick and vibration were tested in all extremities. Proprioception tested in the upper extremities was normal. Coordination: Rapid alternating movements in the fingers/hands was normal. Finger-to-nose maneuver  normal without evidence of ataxia, dysmetria or tremor. Gait and station: Patient walks without assistive device. Strength within normal limits. Stance is stable and normal.   Deep tendon reflexes: in the  upper and lower extremities are symmetric and intact.    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   2)  EDS-She has an irresistible urge to sleep at 11 AM and 3 Pm . She also wakes with a dry, burning mouth.   3) ear pain- otitis media in November/ December  2019.      The patient was advised of the nature of the diagnosed disorder , the treatment options and the  risks for general health and wellness arising from not treating the condition.   I spent more than 45 minutes of face to face time with the patient.  Greater than 50% of time was spent in counseling and coordination of care. We have discussed the diagnosis and differential and I answered the patient's questions.    Plan:  Treatment plan and additional workup :  The patient should undergo an attended sleep study- I need to rule out OSA, see if she has tachycardia with hot flashes, or hypoxemia.  If negative for all of the sleep apnea types, will consider change from Effexor to Zoloft.  Urged to discuss HST with Dr. Sabra Heck.    Larey Seat, MD 12/15/6859, 68:37 AM  Certified in Neurology by [employer]BP[employer] Certified in [place] by [facility] [address] [place],  [address]

## 2018-12-13 ENCOUNTER — Other Ambulatory Visit: Payer: Self-pay | Admitting: Neurology

## 2018-12-13 ENCOUNTER — Telehealth: Payer: Self-pay

## 2018-12-13 DIAGNOSIS — R0683 Snoring: Secondary | ICD-10-CM

## 2018-12-13 DIAGNOSIS — N951 Menopausal and female climacteric states: Secondary | ICD-10-CM

## 2018-12-13 DIAGNOSIS — R351 Nocturia: Secondary | ICD-10-CM

## 2018-12-13 DIAGNOSIS — G4719 Other hypersomnia: Secondary | ICD-10-CM

## 2018-12-13 DIAGNOSIS — G478 Other sleep disorders: Secondary | ICD-10-CM

## 2018-12-13 NOTE — Telephone Encounter (Signed)
Health team advantage denied in lab sleep study. Need HST order please.

## 2018-12-13 NOTE — Telephone Encounter (Signed)
Order placed

## 2018-12-16 ENCOUNTER — Other Ambulatory Visit: Payer: Self-pay

## 2018-12-16 ENCOUNTER — Telehealth: Payer: Self-pay

## 2018-12-16 MED ORDER — CLONIDINE HCL 0.2 MG PO TABS: 0.2000 mg | ORAL_TABLET | Freq: Two times a day (BID) | ORAL | 1 refills | Status: DC

## 2019-01-06 DIAGNOSIS — H1013 Acute atopic conjunctivitis, bilateral: Secondary | ICD-10-CM | POA: Diagnosis not present

## 2019-01-09 NOTE — Telephone Encounter (Signed)
error 

## 2019-03-20 ENCOUNTER — Telehealth: Payer: Self-pay

## 2019-03-20 NOTE — Telephone Encounter (Signed)
Pt has decided not to schedule HST now. Pt has made changes that MD recommended and pt is sleeping so much better now. Informed pt to call us back if anything changes.

## 2019-06-07 DIAGNOSIS — S70261A Insect bite (nonvenomous), right hip, initial encounter: Secondary | ICD-10-CM | POA: Diagnosis not present

## 2019-07-11 ENCOUNTER — Other Ambulatory Visit (HOSPITAL_COMMUNITY): Payer: Self-pay | Admitting: Family Medicine

## 2019-07-11 DIAGNOSIS — Z800 Family history of malignant neoplasm of digestive organs: Secondary | ICD-10-CM | POA: Diagnosis not present

## 2019-07-11 DIAGNOSIS — Z0000 Encounter for general adult medical examination without abnormal findings: Secondary | ICD-10-CM | POA: Diagnosis not present

## 2019-07-11 DIAGNOSIS — J302 Other seasonal allergic rhinitis: Secondary | ICD-10-CM | POA: Diagnosis not present

## 2019-07-11 DIAGNOSIS — R0989 Other specified symptoms and signs involving the circulatory and respiratory systems: Secondary | ICD-10-CM

## 2019-07-11 DIAGNOSIS — E663 Overweight: Secondary | ICD-10-CM | POA: Diagnosis not present

## 2019-07-11 DIAGNOSIS — Z86010 Personal history of colonic polyps: Secondary | ICD-10-CM | POA: Diagnosis not present

## 2019-07-11 DIAGNOSIS — Z6832 Body mass index (BMI) 32.0-32.9, adult: Secondary | ICD-10-CM | POA: Diagnosis not present

## 2019-07-11 DIAGNOSIS — E7800 Pure hypercholesterolemia, unspecified: Secondary | ICD-10-CM | POA: Diagnosis not present

## 2019-07-11 DIAGNOSIS — I10 Essential (primary) hypertension: Secondary | ICD-10-CM | POA: Diagnosis not present

## 2019-07-17 ENCOUNTER — Other Ambulatory Visit: Payer: Self-pay

## 2019-07-17 ENCOUNTER — Ambulatory Visit (HOSPITAL_COMMUNITY)
Admission: RE | Admit: 2019-07-17 | Discharge: 2019-07-17 | Disposition: A | Discharge status: Home / Self Care | Payer: PPO | Source: Ambulatory Visit | Attending: Family Medicine | Admitting: Family Medicine

## 2019-07-17 DIAGNOSIS — R0989 Other specified symptoms and signs involving the circulatory and respiratory systems: Secondary | ICD-10-CM | POA: Diagnosis not present

## 2019-08-28 ENCOUNTER — Other Ambulatory Visit: Payer: Self-pay | Admitting: Family Medicine

## 2019-08-28 DIAGNOSIS — Z1231 Encounter for screening mammogram for malignant neoplasm of breast: Secondary | ICD-10-CM

## 2019-09-11 DIAGNOSIS — B029 Zoster without complications: Secondary | ICD-10-CM | POA: Diagnosis not present

## 2019-10-23 DIAGNOSIS — M180 Bilateral primary osteoarthritis of first carpometacarpal joints: Secondary | ICD-10-CM | POA: Diagnosis not present

## 2019-10-23 DIAGNOSIS — E7800 Pure hypercholesterolemia, unspecified: Secondary | ICD-10-CM | POA: Diagnosis not present

## 2019-10-23 DIAGNOSIS — I10 Essential (primary) hypertension: Secondary | ICD-10-CM | POA: Diagnosis not present

## 2019-10-24 ENCOUNTER — Ambulatory Visit
Admission: RE | Admit: 2019-10-24 | Discharge: 2019-10-24 | Disposition: A | Discharge status: Home / Self Care | Payer: PPO | Source: Ambulatory Visit | Attending: Family Medicine | Admitting: Family Medicine

## 2019-10-24 ENCOUNTER — Other Ambulatory Visit: Payer: Self-pay

## 2019-10-24 DIAGNOSIS — Z1231 Encounter for screening mammogram for malignant neoplasm of breast: Secondary | ICD-10-CM | POA: Diagnosis not present

## 2019-11-21 DIAGNOSIS — E7800 Pure hypercholesterolemia, unspecified: Secondary | ICD-10-CM | POA: Diagnosis not present

## 2019-11-21 DIAGNOSIS — R42 Dizziness and giddiness: Secondary | ICD-10-CM | POA: Diagnosis not present

## 2019-11-21 DIAGNOSIS — I10 Essential (primary) hypertension: Secondary | ICD-10-CM | POA: Diagnosis not present

## 2019-12-05 DIAGNOSIS — R42 Dizziness and giddiness: Secondary | ICD-10-CM | POA: Diagnosis not present

## 2019-12-05 DIAGNOSIS — R17 Unspecified jaundice: Secondary | ICD-10-CM | POA: Diagnosis not present

## 2019-12-05 DIAGNOSIS — I10 Essential (primary) hypertension: Secondary | ICD-10-CM | POA: Diagnosis not present

## 2019-12-08 DIAGNOSIS — R3989 Other symptoms and signs involving the genitourinary system: Secondary | ICD-10-CM | POA: Diagnosis not present

## 2019-12-19 DIAGNOSIS — I10 Essential (primary) hypertension: Secondary | ICD-10-CM | POA: Diagnosis not present

## 2019-12-19 DIAGNOSIS — R17 Unspecified jaundice: Secondary | ICD-10-CM | POA: Diagnosis not present

## 2020-02-21 DIAGNOSIS — E559 Vitamin D deficiency, unspecified: Secondary | ICD-10-CM | POA: Diagnosis not present

## 2020-02-28 DIAGNOSIS — I10 Essential (primary) hypertension: Secondary | ICD-10-CM | POA: Diagnosis not present

## 2020-02-28 DIAGNOSIS — M533 Sacrococcygeal disorders, not elsewhere classified: Secondary | ICD-10-CM | POA: Diagnosis not present

## 2020-02-28 DIAGNOSIS — Z6831 Body mass index (BMI) 31.0-31.9, adult: Secondary | ICD-10-CM | POA: Diagnosis not present

## 2020-04-02 DIAGNOSIS — E7800 Pure hypercholesterolemia, unspecified: Secondary | ICD-10-CM | POA: Diagnosis not present

## 2020-04-02 DIAGNOSIS — M180 Bilateral primary osteoarthritis of first carpometacarpal joints: Secondary | ICD-10-CM | POA: Diagnosis not present

## 2020-04-02 DIAGNOSIS — I10 Essential (primary) hypertension: Secondary | ICD-10-CM | POA: Diagnosis not present

## 2020-04-22 DIAGNOSIS — I10 Essential (primary) hypertension: Secondary | ICD-10-CM | POA: Diagnosis not present

## 2020-04-22 DIAGNOSIS — E7800 Pure hypercholesterolemia, unspecified: Secondary | ICD-10-CM | POA: Diagnosis not present

## 2020-04-22 DIAGNOSIS — M180 Bilateral primary osteoarthritis of first carpometacarpal joints: Secondary | ICD-10-CM | POA: Diagnosis not present

## 2020-07-16 DIAGNOSIS — M8580 Other specified disorders of bone density and structure, unspecified site: Secondary | ICD-10-CM | POA: Diagnosis not present

## 2020-07-16 DIAGNOSIS — I10 Essential (primary) hypertension: Secondary | ICD-10-CM | POA: Diagnosis not present

## 2020-07-16 DIAGNOSIS — Z6830 Body mass index (BMI) 30.0-30.9, adult: Secondary | ICD-10-CM | POA: Diagnosis not present

## 2020-07-16 DIAGNOSIS — Z0000 Encounter for general adult medical examination without abnormal findings: Secondary | ICD-10-CM | POA: Diagnosis not present

## 2020-07-16 DIAGNOSIS — E7800 Pure hypercholesterolemia, unspecified: Secondary | ICD-10-CM | POA: Diagnosis not present

## 2020-07-16 DIAGNOSIS — E559 Vitamin D deficiency, unspecified: Secondary | ICD-10-CM | POA: Diagnosis not present

## 2020-07-16 DIAGNOSIS — E669 Obesity, unspecified: Secondary | ICD-10-CM | POA: Diagnosis not present

## 2020-07-16 DIAGNOSIS — Z780 Asymptomatic menopausal state: Secondary | ICD-10-CM | POA: Diagnosis not present

## 2020-07-18 ENCOUNTER — Other Ambulatory Visit: Payer: Self-pay | Admitting: Family Medicine

## 2020-07-18 DIAGNOSIS — Z1231 Encounter for screening mammogram for malignant neoplasm of breast: Secondary | ICD-10-CM

## 2020-08-30 DIAGNOSIS — Z800 Family history of malignant neoplasm of digestive organs: Secondary | ICD-10-CM | POA: Diagnosis not present

## 2020-08-30 DIAGNOSIS — Z86010 Personal history of colonic polyps: Secondary | ICD-10-CM | POA: Diagnosis not present

## 2020-08-30 DIAGNOSIS — K146 Glossodynia: Secondary | ICD-10-CM | POA: Diagnosis not present

## 2020-10-25 DIAGNOSIS — Z01812 Encounter for preprocedural laboratory examination: Secondary | ICD-10-CM | POA: Diagnosis not present

## 2020-10-30 ENCOUNTER — Ambulatory Visit: Payer: PPO

## 2020-10-30 DIAGNOSIS — K635 Polyp of colon: Secondary | ICD-10-CM | POA: Diagnosis not present

## 2020-10-30 DIAGNOSIS — Z86010 Personal history of colonic polyps: Secondary | ICD-10-CM | POA: Diagnosis not present

## 2020-10-30 DIAGNOSIS — K5730 Diverticulosis of large intestine without perforation or abscess without bleeding: Secondary | ICD-10-CM | POA: Diagnosis not present

## 2020-10-30 DIAGNOSIS — K644 Residual hemorrhoidal skin tags: Secondary | ICD-10-CM | POA: Diagnosis not present

## 2020-10-30 DIAGNOSIS — D123 Benign neoplasm of transverse colon: Secondary | ICD-10-CM | POA: Diagnosis not present

## 2020-10-30 DIAGNOSIS — D128 Benign neoplasm of rectum: Secondary | ICD-10-CM | POA: Diagnosis not present

## 2020-10-30 DIAGNOSIS — K648 Other hemorrhoids: Secondary | ICD-10-CM | POA: Diagnosis not present

## 2020-11-05 DIAGNOSIS — D128 Benign neoplasm of rectum: Secondary | ICD-10-CM | POA: Diagnosis not present

## 2020-11-05 DIAGNOSIS — D123 Benign neoplasm of transverse colon: Secondary | ICD-10-CM | POA: Diagnosis not present

## 2020-11-05 DIAGNOSIS — K635 Polyp of colon: Secondary | ICD-10-CM | POA: Diagnosis not present

## 2020-11-07 ENCOUNTER — Other Ambulatory Visit: Payer: Self-pay

## 2020-11-07 ENCOUNTER — Ambulatory Visit
Admission: RE | Admit: 2020-11-07 | Discharge: 2020-11-07 | Disposition: A | Discharge status: Home / Self Care | Payer: PPO | Source: Ambulatory Visit | Attending: Family Medicine | Admitting: Family Medicine

## 2020-11-07 DIAGNOSIS — Z1231 Encounter for screening mammogram for malignant neoplasm of breast: Secondary | ICD-10-CM

## 2021-02-24 DIAGNOSIS — B354 Tinea corporis: Secondary | ICD-10-CM | POA: Diagnosis not present

## 2021-03-04 DIAGNOSIS — H2513 Age-related nuclear cataract, bilateral: Secondary | ICD-10-CM | POA: Diagnosis not present

## 2021-05-07 DIAGNOSIS — H8110 Benign paroxysmal vertigo, unspecified ear: Secondary | ICD-10-CM | POA: Diagnosis not present

## 2021-05-07 DIAGNOSIS — J0100 Acute maxillary sinusitis, unspecified: Secondary | ICD-10-CM | POA: Diagnosis not present

## 2021-05-07 DIAGNOSIS — R110 Nausea: Secondary | ICD-10-CM | POA: Diagnosis not present

## 2021-05-15 DIAGNOSIS — B029 Zoster without complications: Secondary | ICD-10-CM | POA: Diagnosis not present

## 2021-07-23 DIAGNOSIS — I10 Essential (primary) hypertension: Secondary | ICD-10-CM | POA: Diagnosis not present

## 2021-07-23 DIAGNOSIS — K146 Glossodynia: Secondary | ICD-10-CM | POA: Diagnosis not present

## 2021-07-23 DIAGNOSIS — Z23 Encounter for immunization: Secondary | ICD-10-CM | POA: Diagnosis not present

## 2021-07-23 DIAGNOSIS — E7800 Pure hypercholesterolemia, unspecified: Secondary | ICD-10-CM | POA: Diagnosis not present

## 2021-07-23 DIAGNOSIS — E559 Vitamin D deficiency, unspecified: Secondary | ICD-10-CM | POA: Diagnosis not present

## 2021-07-23 DIAGNOSIS — E663 Overweight: Secondary | ICD-10-CM | POA: Diagnosis not present

## 2021-07-31 DIAGNOSIS — Z1389 Encounter for screening for other disorder: Secondary | ICD-10-CM | POA: Diagnosis not present

## 2021-07-31 DIAGNOSIS — Z0000 Encounter for general adult medical examination without abnormal findings: Secondary | ICD-10-CM | POA: Diagnosis not present

## 2021-08-14 DIAGNOSIS — Z23 Encounter for immunization: Secondary | ICD-10-CM | POA: Diagnosis not present

## 2021-09-26 DIAGNOSIS — B029 Zoster without complications: Secondary | ICD-10-CM | POA: Diagnosis not present

## 2021-09-26 DIAGNOSIS — R21 Rash and other nonspecific skin eruption: Secondary | ICD-10-CM | POA: Diagnosis not present

## 2021-10-03 DIAGNOSIS — R350 Frequency of micturition: Secondary | ICD-10-CM | POA: Diagnosis not present

## 2021-12-05 DIAGNOSIS — R350 Frequency of micturition: Secondary | ICD-10-CM | POA: Diagnosis not present

## 2021-12-21 IMAGING — MG MM DIGITAL SCREENING BILAT W/ TOMO AND CAD
6 of 10 series · 6 of 30 positions shown · non-contrast
Comparison: Previous exam(s).

CLINICAL DATA: Screening.

EXAM:
DIGITAL SCREENING BILATERAL MAMMOGRAM WITH TOMO AND CAD

[R MLO synth-2D]
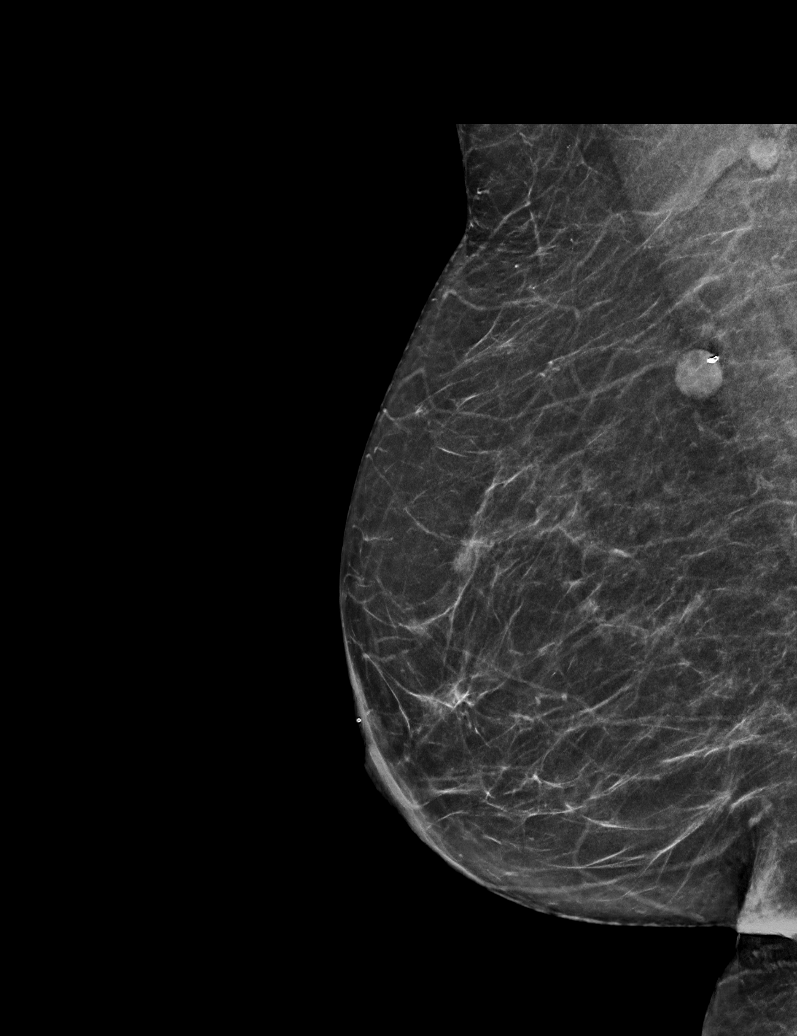

[R CC synth-2D (1 of 2)]
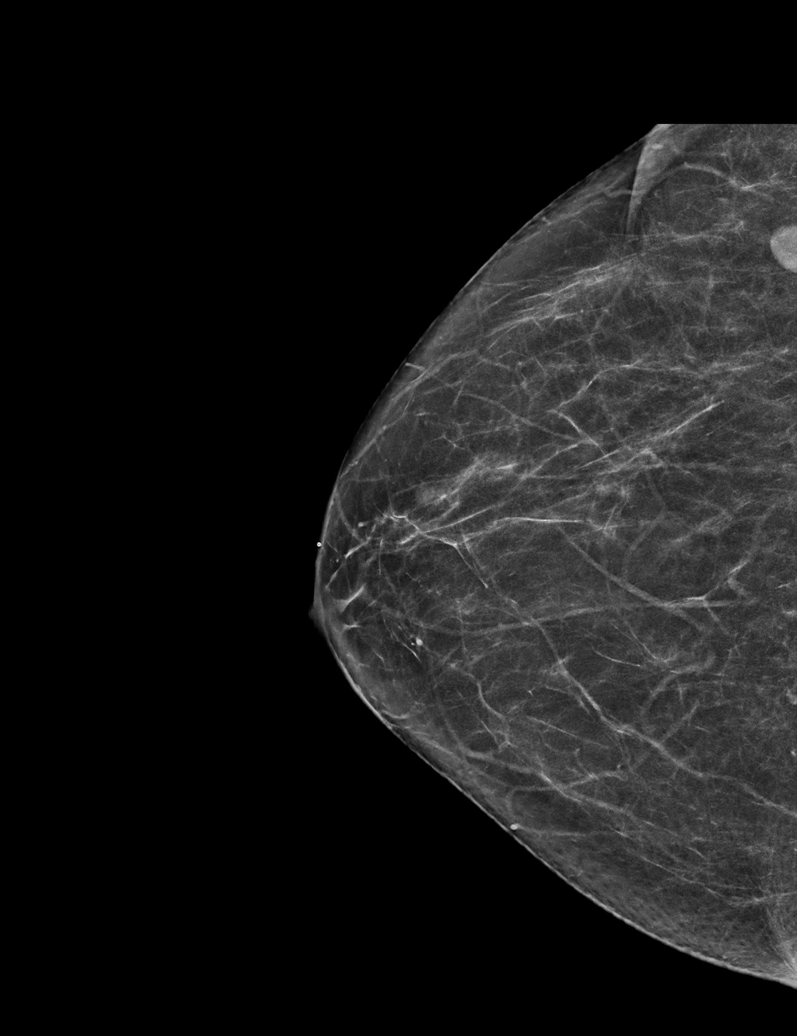

[L MLO synth-2D]
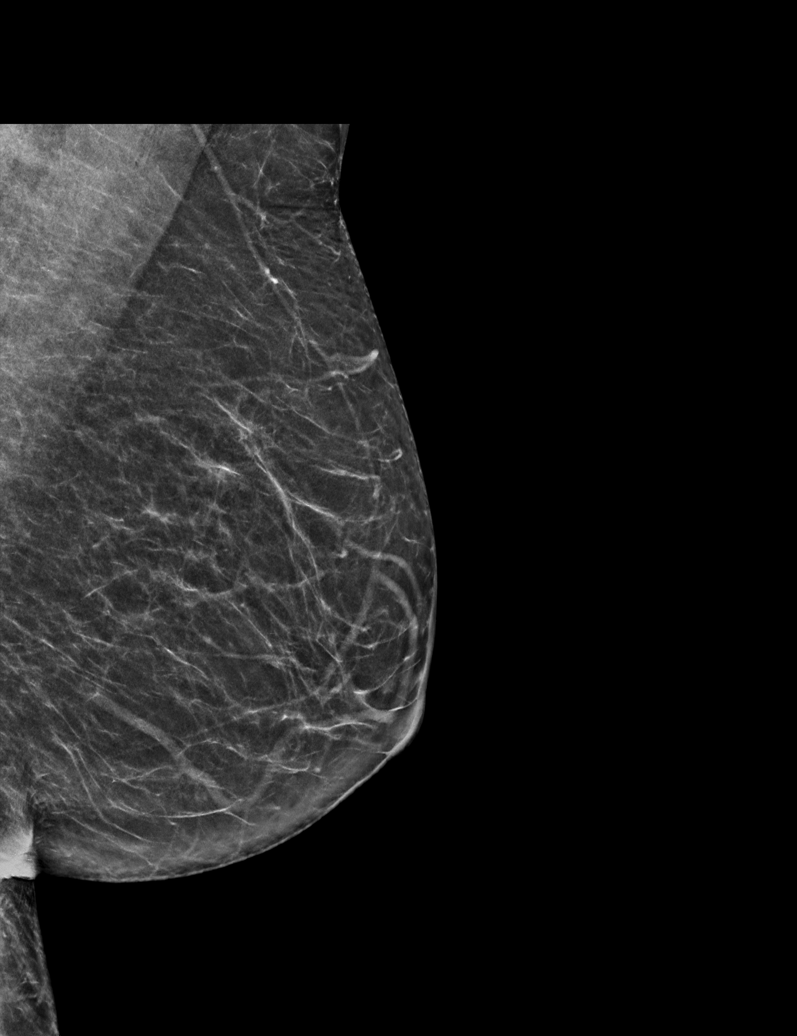

[R CC synth-2D (2 of 2)]
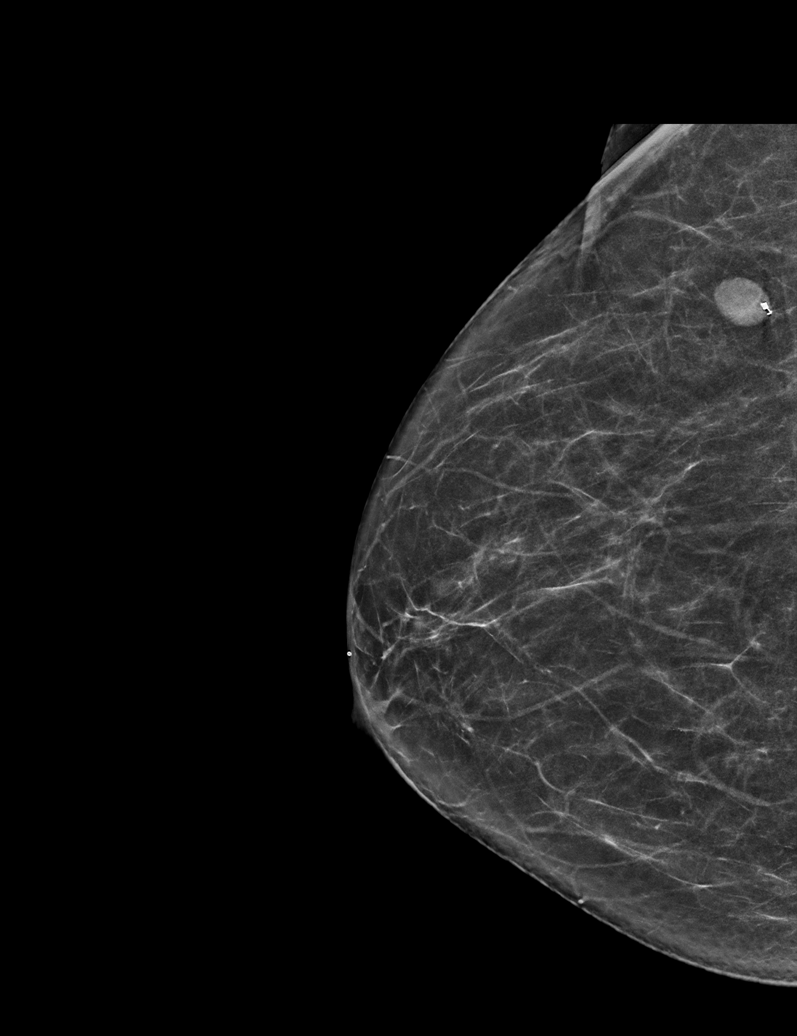

[L CC synth-2D]
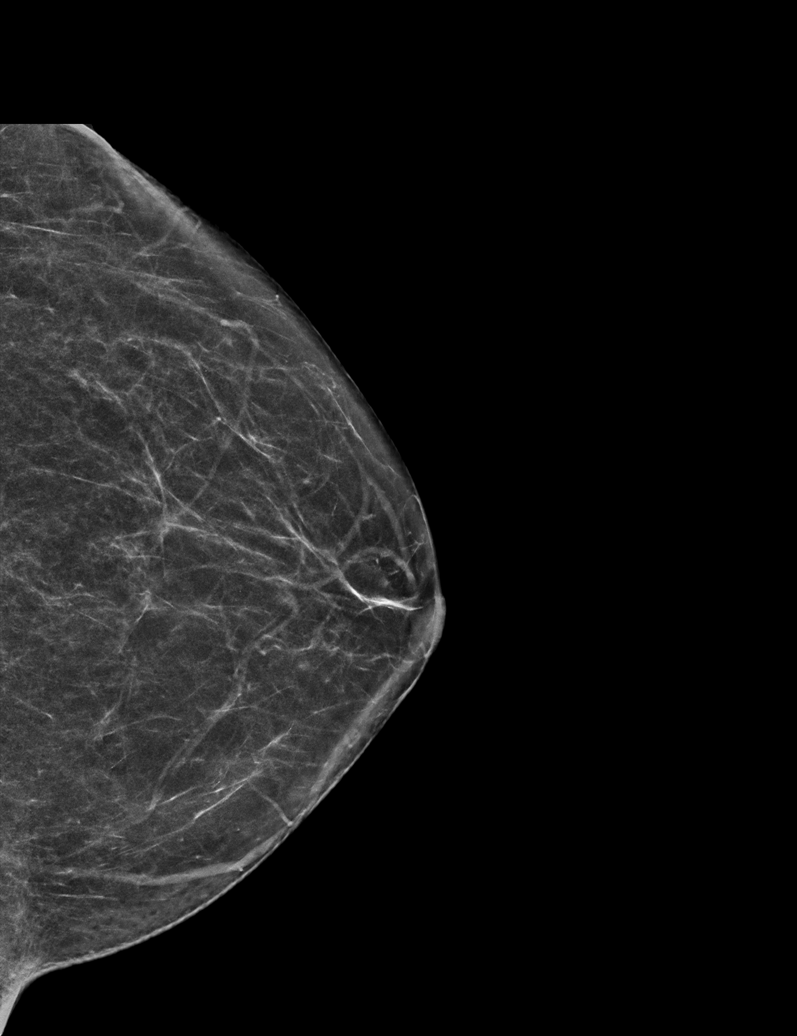

[L CC tomo · tomo slice 29/56.0]
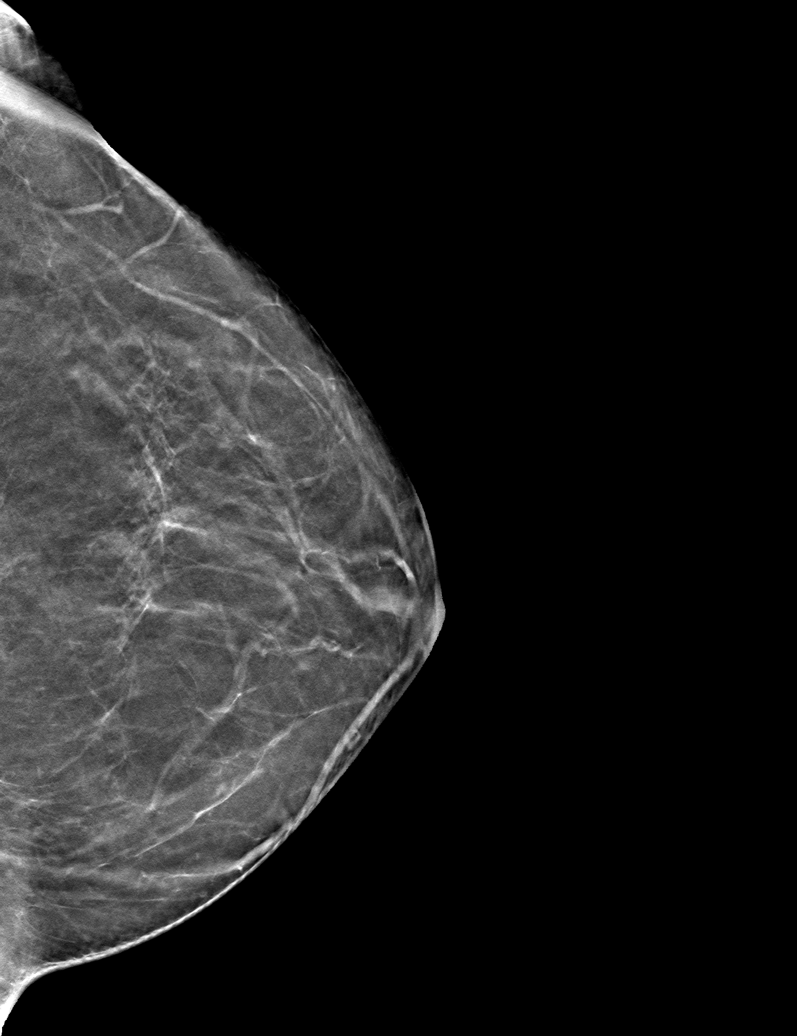

[6 of 30 positions shown; findings below may reference images not displayed]

ACR Breast Density Category b: There are scattered areas of
fibroglandular density.
FINDINGS: There are no findings suspicious for malignancy. The images were
evaluated with computer-aided detection.
IMPRESSION: No mammographic evidence of malignancy. A result letter of this
screening mammogram will be mailed directly to the patient.

RECOMMENDATION:
Screening mammogram in one year. (Code:ZP-7-VX7)

BI-RADS CATEGORY  1: Negative.

## 2021-12-22 ENCOUNTER — Other Ambulatory Visit: Payer: Self-pay | Admitting: Family Medicine

## 2021-12-22 DIAGNOSIS — Z1231 Encounter for screening mammogram for malignant neoplasm of breast: Secondary | ICD-10-CM

## 2021-12-26 ENCOUNTER — Ambulatory Visit: Payer: PPO

## 2022-01-01 ENCOUNTER — Ambulatory Visit
Admission: RE | Admit: 2022-01-01 | Discharge: 2022-01-01 | Disposition: A | Discharge status: Home / Self Care | Payer: PPO | Source: Ambulatory Visit | Attending: Family Medicine | Admitting: Family Medicine

## 2022-01-01 DIAGNOSIS — Z1231 Encounter for screening mammogram for malignant neoplasm of breast: Secondary | ICD-10-CM

## 2022-02-24 DIAGNOSIS — L255 Unspecified contact dermatitis due to plants, except food: Secondary | ICD-10-CM | POA: Diagnosis not present

## 2022-03-12 DIAGNOSIS — M25562 Pain in left knee: Secondary | ICD-10-CM | POA: Diagnosis not present

## 2022-04-01 DIAGNOSIS — R3915 Urgency of urination: Secondary | ICD-10-CM | POA: Diagnosis not present

## 2022-04-20 DIAGNOSIS — N390 Urinary tract infection, site not specified: Secondary | ICD-10-CM | POA: Diagnosis not present

## 2022-04-30 ENCOUNTER — Ambulatory Visit: Payer: PPO | Admitting: Podiatry

## 2022-05-14 ENCOUNTER — Ambulatory Visit: Payer: PPO | Admitting: Orthopaedic Surgery

## 2022-05-14 ENCOUNTER — Encounter: Payer: Self-pay | Admitting: Orthopaedic Surgery

## 2022-05-14 DIAGNOSIS — M25562 Pain in left knee: Secondary | ICD-10-CM

## 2022-05-14 DIAGNOSIS — G8929 Other chronic pain: Secondary | ICD-10-CM

## 2022-05-14 MED ORDER — BUPIVACAINE HCL 0.5 % IJ SOLN
2.0000 mL | INTRAMUSCULAR | Status: AC | PRN
  Administered 2022-05-14: 2 mL via INTRA_ARTICULAR

## 2022-05-14 MED ORDER — LIDOCAINE HCL 1 % IJ SOLN
2.0000 mL | INTRAMUSCULAR | Status: AC | PRN
  Administered 2022-05-14: 2 mL

## 2022-05-14 NOTE — Addendum Note (Signed)
Addended by: Lendon Collar on: 05/14/2022 11:05 AM   Modules accepted: Orders

## 2022-05-14 NOTE — Progress Notes (Signed)
Office Visit Note   Patient: Kristy Henry           Date of Birth: [birth year]           MRN: [id number] Visit Date: 05/14/2022              Requested by: Kathyrn Lass, [place],  [place] [id number] PCP: Kathyrn Lass, MD   Assessment & Plan: Visit Diagnoses:  1. Chronic pain of left knee     Plan: Impression is left knee pain and effusion.  Outside x-rays do show moderate osteoarthritis with joint space narrowing particularly of the medial compartment.  She could have a degenerative meniscal tear as well.  Based on options we aspirated her knee today and obtained 30 cc of synovial fluid which was sent to the lab.  We will also order MRI to rule out structural abnormalities.  Follow-up after the MRI.  Follow-Up Instructions: No follow-ups on file.   Orders:  No orders of the defined types were placed in this encounter.  No orders of the defined types were placed in this encounter.     Procedures: Large Joint Inj: L knee on 05/14/2022 10:51 AM Details: 22 G needle Medications: 2 mL bupivacaine 0.5 %; 2 mL lidocaine 1 % Outcome: tolerated well, no immediate complications Patient was prepped and draped in the usual sterile fashion.       Clinical Data: No additional findings.   Subjective: Chief Complaint  Patient presents with   Left Knee - Pain    HPI Kristy Henry is a 71 year old female here for evaluation of left knee pain since May.  Saw Dr. Gladstone Lighter in June and did a cortisone injection which gave partial relief.  Pain is worse with activity.  No particular injuries but was very active on it when she was moving and started hurting the day after.  Not really reporting any real mechanical symptoms.  Feels like there is a little bit of a click.  Has pain with weightbearing and unable to fully bend or extend the knee due to the swelling. Review of Systems  Constitutional: Negative.   HENT: Negative.    Eyes: Negative.   Respiratory: Negative.     Cardiovascular: Negative.   Endocrine: Negative.   Musculoskeletal: Negative.   Neurological: Negative.   Hematological: Negative.   Psychiatric/Behavioral: Negative.    All other systems reviewed and are negative.    Objective: Vital Signs: There were no vitals taken for this visit.  Physical Exam Vitals and nursing note reviewed.  Constitutional:      Appearance: She is well-developed.  HENT:     Head: Atraumatic.     Nose: Nose normal.  Eyes:     Extraocular Movements: Extraocular movements intact.  Cardiovascular:     Pulses: Normal pulses.  Pulmonary:     Effort: Pulmonary effort is normal.  Abdominal:     Palpations: Abdomen is soft.  Musculoskeletal:     Cervical back: Neck supple.  Skin:    General: Skin is warm.     Capillary Refill: Capillary refill takes less than 2 seconds.  Neurological:     Mental Status: She is alert. Mental status is at baseline.  Psychiatric:        Behavior: Behavior normal.        Thought Content: Thought content normal.        Judgment: Judgment normal.     Ortho Exam Examination left knee shows moderate joint effusion.  No signs of infection.  Pain with range of motion.  Slight crepitus throughout range of motion.  Collaterals and cruciates are stable.  No joint tenderness. Specialty Comments:  No specialty comments available.  Imaging: No results found.   PMFS History: Patient Active Problem List   Diagnosis Date Noted   Non-restorative sleep 12/12/2018   Nocturia more than twice per night 12/12/2018   Snorings 12/12/2018   Menopausal hot flushes 12/12/2018   Excessive daytime sleepiness 12/12/2018   Past Medical History:  Diagnosis Date   High cholesterol    HLD (hyperlipidemia)    Hypertension    Primary sleep disturbance    Syncope and collapse    Vision abnormalities     Family History  Problem Relation Age of Onset   Heart disease Mother    Cancer Father    Colon cancer Father    Breast cancer  Sister 78   High blood pressure Sister    Diabetes type II Brother    Diabetes type II Brother     Past Surgical History:  Procedure Laterality Date   ABDOMINAL HYSTERECTOMY     BONE EXCISION     from bilateral hands   BREAST BIOPSY Right 09/25/2016   eye lid surgery     RECTOCELE REPAIR     Social History   Occupational History   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

## 2022-05-15 LAB — SYNOVIAL FLUID ANALYSIS, COMPLETE
Basophils, %: 0 %
Eosinophils-Synovial: 0 % (ref 0–2)
Lymphocytes-Synovial Fld: 45 % (ref 0–74)
Monocyte/Macrophage: 29 % (ref 0–69)
Neutrophil, Synovial: 26 % — ABNORMAL HIGH (ref 0–24)
Synoviocytes, %: 0 % (ref 0–15)
WBC, Synovial: 774 cells/uL — ABNORMAL HIGH (ref <150)

## 2022-05-19 ENCOUNTER — Other Ambulatory Visit: Payer: Self-pay | Admitting: Orthopaedic Surgery

## 2022-05-19 DIAGNOSIS — G8929 Other chronic pain: Secondary | ICD-10-CM

## 2022-05-22 ENCOUNTER — Ambulatory Visit
Admission: RE | Admit: 2022-05-22 | Discharge: 2022-05-22 | Disposition: A | Discharge status: Home / Self Care | Payer: PPO | Source: Ambulatory Visit | Attending: Orthopaedic Surgery | Admitting: Orthopaedic Surgery

## 2022-05-22 DIAGNOSIS — G8929 Other chronic pain: Secondary | ICD-10-CM

## 2022-05-22 DIAGNOSIS — M25562 Pain in left knee: Secondary | ICD-10-CM | POA: Diagnosis not present

## 2022-06-02 DIAGNOSIS — H2513 Age-related nuclear cataract, bilateral: Secondary | ICD-10-CM | POA: Diagnosis not present

## 2022-06-03 ENCOUNTER — Ambulatory Visit (INDEPENDENT_AMBULATORY_CARE_PROVIDER_SITE_OTHER): Payer: PPO

## 2022-06-03 ENCOUNTER — Ambulatory Visit: Payer: PPO | Admitting: Orthopaedic Surgery

## 2022-06-03 ENCOUNTER — Encounter: Payer: Self-pay | Admitting: Orthopaedic Surgery

## 2022-06-03 DIAGNOSIS — M1712 Unilateral primary osteoarthritis, left knee: Secondary | ICD-10-CM

## 2022-06-03 NOTE — Progress Notes (Signed)
Office Visit Note   Patient: Kristy Henry           Date of Birth: [birth year]           MRN: [id number] Visit Date: 06/03/2022              Requested by: Kathyrn Lass, [place],  [place] [id number] PCP: Kathyrn Lass, MD   Assessment & Plan: Visit Diagnoses:  1. Primary osteoarthritis of left knee     Plan: MRI shows complex tear of the medial and lateral menisci consistent with degenerative tears.  She has grade 4 changes of the medial compartment as well as grade 2-3 changes of the patellofemoral and lateral compartments.  Based on these findings recommended total knee replacement rather than arthroscopic meniscal debridement and chondroplasty as a knee replacement would provide more reliable pain relief without the need for additional surgeries.  Pros and cons of each treatment option reviewed with the patient.  Based on options she is in agreement to move forward with a total knee replacement.  Risk benefits rehab reviewed with the patient.  Denies history of DVT or nickel allergy.  Jackelyn Poling will call the patient to schedule.  We will obtain preoperative medical clearance from Dr. Sabra Heck.  Follow-Up Instructions: No follow-ups on file.   Orders:  Orders Placed This Encounter  Procedures   XR KNEE 3 VIEW LEFT   No orders of the defined types were placed in this encounter.     Procedures: No procedures performed   Clinical Data: No additional findings.   Subjective: Chief Complaint  Patient presents with   Left Knee - Pain    HPI Patient returns today to discuss left knee MRI scan. Review of Systems  Constitutional: Negative.   HENT: Negative.    Eyes: Negative.   Respiratory: Negative.    Cardiovascular: Negative.   Endocrine: Negative.   Musculoskeletal: Negative.   Neurological: Negative.   Hematological: Negative.   Psychiatric/Behavioral: Negative.    All other systems reviewed and are negative.    Objective: Vital Signs:  There were no vitals taken for this visit.  Physical Exam Vitals and nursing note reviewed.  Constitutional:      Appearance: She is well-developed.  HENT:     Head: Normocephalic and atraumatic.  Pulmonary:     Effort: Pulmonary effort is normal.  Abdominal:     Palpations: Abdomen is soft.  Musculoskeletal:     Cervical back: Neck supple.  Skin:    General: Skin is warm.     Capillary Refill: Capillary refill takes less than 2 seconds.  Neurological:     Mental Status: She is alert and oriented to person, place, and time.  Psychiatric:        Behavior: Behavior normal.        Thought Content: Thought content normal.        Judgment: Judgment normal.     Ortho Exam Examination left knee is unchanged. Specialty Comments:  No specialty comments available.  Imaging: No results found.   PMFS History: Patient Active Problem List   Diagnosis Date Noted   Primary osteoarthritis of left knee 06/03/2022   Non-restorative sleep 12/12/2018   Nocturia more than twice per night 12/12/2018   Snorings 12/12/2018   Menopausal hot flushes 12/12/2018   Excessive daytime sleepiness 12/12/2018   Past Medical History:  Diagnosis Date   High cholesterol    HLD (hyperlipidemia)    Hypertension    Primary  sleep disturbance    Syncope and collapse    Vision abnormalities     Family History  Problem Relation Age of Onset   Heart disease Mother    Cancer Father    Colon cancer Father    Breast cancer Sister 70   High blood pressure Sister    Diabetes type II Brother    Diabetes type II Brother     Past Surgical History:  Procedure Laterality Date   ABDOMINAL HYSTERECTOMY     BONE EXCISION     from bilateral hands   BREAST BIOPSY Right 09/25/2016   eye lid surgery     RECTOCELE REPAIR     Social History   Occupational History   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

## 2022-06-16 DIAGNOSIS — E669 Obesity, unspecified: Secondary | ICD-10-CM | POA: Diagnosis not present

## 2022-06-16 DIAGNOSIS — I10 Essential (primary) hypertension: Secondary | ICD-10-CM | POA: Diagnosis not present

## 2022-06-16 DIAGNOSIS — E7800 Pure hypercholesterolemia, unspecified: Secondary | ICD-10-CM | POA: Diagnosis not present

## 2022-06-16 DIAGNOSIS — M1712 Unilateral primary osteoarthritis, left knee: Secondary | ICD-10-CM | POA: Diagnosis not present

## 2022-06-17 ENCOUNTER — Other Ambulatory Visit: Payer: Self-pay

## 2022-06-18 NOTE — Progress Notes (Signed)
Surgical Instructions    Your procedure is scheduled on Monday, June 29, 2022 at 2:30 PM.  Report to [facility] Main Entrance "A" at 12:30 P.M., then check in with the Admitting office.  Call this number if you have problems the morning of surgery:  [phone]   If you have any questions prior to your surgery date call [phone]: Open Monday-Friday 8am-4pm    Remember:  Do not eat after midnight the night before your surgery  You may drink clear liquids until 11:30 AM the morning of your surgery.   Clear liquids allowed are: Water, Non-Citrus Juices (without pulp), Carbonated Beverages, Clear Tea, Black Coffee Only (NO MILK, CREAM OR POWDERED CREAMER of any kind), and Gatorade.   Enhanced Recovery after Surgery for Orthopedics Enhanced Recovery after Surgery is a protocol used to improve the stress on your body and your recovery after surgery.  Patient Instructions  The day of surgery (if you do NOT have diabetes):  Drink ONE (1) Pre-Surgery Clear Ensure by 11:30 am the morning of surgery   This drink was given to you during your hospital  pre-op appointment visit. Nothing else to drink after completing the  Pre-Surgery Clear Ensure.         If you have questions, please contact your surgeon's office.     Take these medicines the morning of surgery with A SIP OF WATER:  atorvastatin (LIPITOR) sodium chloride (OCEAN) 0.65 % SOLN nasal spray - if needed   As of today, STOP taking any Aspirin (unless otherwise instructed by your surgeon) Aleve, Naproxen, Ibuprofen, Motrin, Advil, Goody's, BC's, all herbal medications, fish oil, and all vitamins.                     Do NOT Smoke (Tobacco/Vaping) for 24 hours prior to your procedure.  If you use a CPAP at night, you may bring your mask/headgear for your overnight stay.   Contacts, glasses, piercing's, hearing aid's, dentures or partials may not be worn into surgery, please bring cases for these belongings.    For  patients admitted to the hospital, discharge time will be determined by your treatment team.   Patients discharged the day of surgery will not be allowed to drive home, and someone needs to stay with them for 24 hours.  SURGICAL WAITING ROOM VISITATION Patients having surgery or a procedure may have two support people in the waiting area. Visitors may stay in the waiting area during the procedure and switch out with other visitors if needed. Children under the age of 28 must have an adult accompany them who is not the patient. If the patient needs to stay at the hospital during part of their recovery, the visitor guidelines for inpatient rooms apply.  Please refer to the [facility] website for the visitor guidelines for Inpatients (after your surgery is over and you are in a regular room).    Special instructions:   [facility]- Preparing For Surgery  Before surgery, you can play an important role. Because skin is not sterile, your skin needs to be as free of germs as possible. You can reduce the number of germs on your skin by washing with CHG (chlorahexidine gluconate) Soap before surgery.  CHG is an antiseptic cleaner which kills germs and bonds with the skin to continue killing germs even after washing.    Oral Hygiene is also important to reduce your risk of infection.  Remember - BRUSH YOUR TEETH THE MORNING OF SURGERY WITH  YOUR REGULAR TOOTHPASTE  Please do not use if you have an allergy to CHG or antibacterial soaps. If your skin becomes reddened/irritated stop using the CHG.  Do not shave (including legs and underarms) for at least 48 hours prior to first CHG shower. It is OK to shave your face.  Please follow these instructions carefully.   Shower the NIGHT BEFORE SURGERY and the MORNING OF SURGERY  If you chose to wash your hair, wash your hair first as usual with your normal shampoo.  After you shampoo, rinse your hair and body thoroughly to remove the shampoo.  Use CHG  Soap as you would any other liquid soap. You can apply CHG directly to the skin and wash gently with a scrungie or a clean washcloth.   Apply the CHG Soap to your body ONLY FROM THE NECK DOWN.  Do not use on open wounds or open sores. Avoid contact with your eyes, ears, mouth and genitals (private parts). Wash Face and genitals (private parts)  with your normal soap.   Wash thoroughly, paying special attention to the area where your surgery will be performed.  Thoroughly rinse your body with warm water from the neck down.  DO NOT shower/wash with your normal soap after using and rinsing off the CHG Soap.  Pat yourself dry with a CLEAN TOWEL.  Wear CLEAN PAJAMAS to bed the night before surgery  Place CLEAN SHEETS on your bed the night before your surgery  DO NOT SLEEP WITH PETS.   Day of Surgery: Take a shower with CHG soap. Do not wear jewelry or makeup Do not wear lotions, powders, perfumes/colognes, or deodorant. Do not shave 48 hours prior to surgery. Do not bring valuables to the hospital.  [facility] is not responsible for any belongings or valuables. Do not wear nail polish, gel polish, artificial nails, or any other type of covering on natural nails (fingers and toes) If you have artificial nails or gel coating that need to be removed by a nail salon, please have this removed prior to surgery. Artificial nails or gel coating may interfere with anesthesia's ability to adequately monitor your vital signs. Wear Clean/Comfortable clothing the morning of surgery Do not apply any deodorants/lotions.   Remember to brush your teeth WITH YOUR REGULAR TOOTHPASTE.   Please read over the following fact sheets that you were given.  If you received a COVID test during your pre-op visit  it is requested that you wear a mask when out in public, stay away from anyone that may not be feeling well and notify your surgeon if you develop symptoms. If you have been in contact with anyone that has  tested positive in the last 10 days please notify you surgeon.

## 2022-06-19 ENCOUNTER — Encounter (HOSPITAL_COMMUNITY)
Admission: RE | Admit: 2022-06-19 | Discharge: 2022-06-19 | Disposition: A | Discharge status: Home / Self Care | Payer: PPO | Source: Ambulatory Visit | Attending: Orthopaedic Surgery | Admitting: Orthopaedic Surgery

## 2022-06-19 ENCOUNTER — Encounter (HOSPITAL_COMMUNITY): Payer: Self-pay

## 2022-06-19 ENCOUNTER — Other Ambulatory Visit: Payer: Self-pay

## 2022-06-19 VITALS — BP 154/69 | HR 81 | Temp 98.2°F | Resp 17 | Ht 62.0 in | Wt 170.0 lb

## 2022-06-19 DIAGNOSIS — M1712 Unilateral primary osteoarthritis, left knee: Secondary | ICD-10-CM | POA: Insufficient documentation

## 2022-06-19 DIAGNOSIS — I119 Hypertensive heart disease without heart failure: Secondary | ICD-10-CM | POA: Diagnosis not present

## 2022-06-19 DIAGNOSIS — Z01818 Encounter for other preprocedural examination: Secondary | ICD-10-CM | POA: Insufficient documentation

## 2022-06-19 DIAGNOSIS — Z87891 Personal history of nicotine dependence: Secondary | ICD-10-CM | POA: Diagnosis not present

## 2022-06-19 DIAGNOSIS — I10 Essential (primary) hypertension: Secondary | ICD-10-CM

## 2022-06-19 HISTORY — DX: Unspecified osteoarthritis, unspecified site: M19.90

## 2022-06-19 HISTORY — DX: Depression, unspecified: F32.A

## 2022-06-19 HISTORY — DX: Anxiety disorder, unspecified: F41.9

## 2022-06-19 HISTORY — DX: Pneumonia, unspecified organism: J18.9

## 2022-06-19 LAB — SURGICAL PCR SCREEN
MRSA, PCR: NEGATIVE
Staphylococcus aureus: NEGATIVE

## 2022-06-19 NOTE — Progress Notes (Signed)
PCP - Dr. Kathyrn Lass Cardiologist - Denies  PPM/ICD - Denies  Chest x-ray - N/A EKG - Requested Stress Test - Denies ECHO - Denies Cardiac Cath - Denies  Sleep Study - Denies  Diabetes: Denies  Blood Thinner Instructions: N/A Aspirin Instructions: N/A  ERAS Protcol - Yes, PRE-SURGERY Ensure  COVID TEST- N/A    Anesthesia review: yes, labs & EKG requested from PCP  Patient denies shortness of breath, fever, cough and chest pain at PAT appointment   All instructions explained to the patient, with a verbal understanding of the material. Patient agrees to go over the instructions while at home for a better understanding. Patient also instructed to self quarantine after being tested for COVID-19. The opportunity to ask questions was provided.

## 2022-06-22 ENCOUNTER — Encounter (HOSPITAL_COMMUNITY): Payer: Self-pay

## 2022-06-22 ENCOUNTER — Other Ambulatory Visit: Payer: Self-pay | Admitting: Physician Assistant

## 2022-06-22 MED ORDER — ASPIRIN 81 MG PO TBEC: 81.0000 mg | DELAYED_RELEASE_TABLET | Freq: Two times a day (BID) | ORAL | 0 refills | Status: DC

## 2022-06-22 MED ORDER — DOCUSATE SODIUM 100 MG PO CAPS: 100.0000 mg | ORAL_CAPSULE | Freq: Every day | ORAL | 2 refills | Status: DC | PRN

## 2022-06-22 MED ORDER — METHOCARBAMOL 750 MG PO TABS: 750.0000 mg | ORAL_TABLET | Freq: Two times a day (BID) | ORAL | 2 refills | Status: DC | PRN

## 2022-06-22 MED ORDER — ONDANSETRON HCL 4 MG PO TABS: 4.0000 mg | ORAL_TABLET | Freq: Three times a day (TID) | ORAL | 0 refills | Status: DC | PRN

## 2022-06-22 MED ORDER — ONDANSETRON HCL 4 MG PO TABS
4.0000 mg | ORAL_TABLET | Freq: Three times a day (TID) | ORAL | 0 refills | Status: DC | PRN
Start: 2022-06-22 — End: 2022-07-23

## 2022-06-22 MED ORDER — HYDROMORPHONE HCL 2 MG PO TABS: 2.0000 mg | ORAL_TABLET | Freq: Four times a day (QID) | ORAL | 0 refills | Status: DC | PRN

## 2022-06-22 NOTE — Progress Notes (Signed)
Anesthesia Chart Review:  Case: [id number] Date/Time: 06/29/22 1415   Procedure: LEFT TOTAL KNEE ARTHROPLASTY (Left: Knee)   Anesthesia type: Spinal   Pre-op diagnosis: left knee degenerative joint disease   Location: [facility] OR ROOM 06 / [place] OR   Surgeons: Leandrew Koyanagi, MD       DISCUSSION: Patient is a 71 year old female scheduled for the above procedure.  History includes former smoker, HTN, HLD, anxiety, depression, syncope (08/2018, unremarkable cardiac evaluation per Dr. Einar Gip).  Dr. Erlinda Hong requested preoperative surgery clearance from PCP Dr. Sabra Heck. She was seen on 06/16/22 with EKG and labs (copies on shadow chart).  06/16/2022 CBC and CMP results from [place] appear acceptable for OR.   Anesthesia team to evaluate on the day of surgery.    VS: BP (!) 154/69   Pulse 81   Temp 36.8 C   Resp 17   Ht '5\' 2"'  (1.575 m)   Wt 77.1 kg   SpO2 99%   BMI 31.09 kg/m    PROVIDERS: Kathyrn Lass, MD is PCP ([place] [facility]) - She was evaluated by neurologist Dohmeier, Asencion Partridge, MD on 12/12/18 for sleep disturbance, favored to be delayed menopausal syndrome, but sleep study ordered but per 03/20/19 notation, patient never scheduled HST because her physician had made "changes" and she was sleeping much better.  - She is not followed by cardiology routinely. She had evaluation By Adrian Prows, MD at [facility] in 2019 for dizziness/syncope. Unremarkable work-up. Dizziness improved after treatment for left ear infection. Referred for sleep evaluation due to mid morning sleepiness. As needed cardiology follow-up recommended.   LABS:  Labs on 06/16/22 [facility]) include CBC with diff, Lipid panel, CMP. Results included: Glucose 97, BUN 17, creatinine 0.91, EGFR 67, sodium 134, potassium 4.7, calcium 9.9, total protein 7.1, albumin 4.5, total bilirubin 0.4, alkaline phosphatase 101, AST 16, ALT 18, WBC 10.4, hemoglobin 13.1, hematocrit 39.1, platelet count 337.  Copies are on shadow chart.    IMAGES: MRI Left Knee 05/22/22: IMPRESSION: 1. Maceration of the body and posterior horn of the medial meniscus. 2. Complex tear of the posterior horn of the lateral meniscus. Degeneration of the body of the lateral meniscus. Oblique tear of the anterior horn of the lateral meniscus extending to the inferior articular surface. 3. Tricompartmental cartilage abnormalities as described above. 4. Large joint effusion.    EKG: 06/16/22 [facility]): Sinus rhythm at 82 bpm.  Left atrial lodgment.  Anterior infarct, age undetermined. (Copy on shadow chart.)  EKG 08/31/18 [facility]): NSR with frequent PVCs.   CV: Nuclear exercise stress test 10/03/18 [facility]): Impression: 1.  The patient performed treadmill exercise using a Bruce protocol, completing 5:15 minutes.  The patient completed an estimated workload of 7 METS, reaching 96% of the maximum predicted heart rate.  Normal hemodynamic response was seen.  Stress symptoms included dyspnea.  Exercise capacity was low.  The stress electrocardiogram showed sinus tachycardia, normal stress conduction, no stress arrhythmias and normal stress repolarization.  No ischemic changes seen on stress electrocardiogram. 2.  The overall quality of the study is good.  There is no evidence of abnormal lung activity.  Stress and rest SPECT images demonstrate homogeneous tracer distribution throughout the myocardium.  Gated SPECT imaging reveals normal myocardial thickening and wall motion.  The left ventricular ejection fraction was calculated 46%, although visually appears normal. 3.  Low risk study.   Echocardiogram 09/27/18 [facility] CV): Conclusions: Left ventricle cavity is normal in size.  Moderate concentric hypertrophy of the left ventricle.  Normal global wall motion.  Doppler evidence of grade 1 (impaired) diastolic dysfunction, normal LAP.  Calculated EF 64%. Trace mitral regurgitation.  Trace pulmonic  regurgitation.   US Carotid 09/27/18 [facility] CV): Conclusions: Minimal stenosis in the right internal carotid artery.  No stenosis left ICA.  Minimal mixed plaque. Antegrade right vertebral artery fluid.  Antegrade left vertebral artery flow, suggest patent bilateral subclavian artery antegrade flow.     Past Medical History:  Diagnosis Date   Anxiety    Arthritis    Depression    High cholesterol    HLD (hyperlipidemia)    Hypertension    Pneumonia    Primary sleep disturbance    Syncope and collapse 2019   Vision abnormalities     Past Surgical History:  Procedure Laterality Date   ABDOMINAL HYSTERECTOMY     BONE EXCISION     from bilateral hands   BREAST BIOPSY Right 09/25/2016   eye lid surgery     RECTOCELE REPAIR      MEDICATIONS:  atorvastatin (LIPITOR) 10 MG tablet   Barberry-Oreg Grape-Goldenseal (BERBERINE COMPLEX PO)   Cyanocobalamin (B-12) 5000 MCG SUBL   hydrochlorothiazide (HYDRODIURIL) 25 MG tablet   losartan (COZAAR) 50 MG tablet   MAGNESIUM PO   melatonin 1 MG TABS tablet   Misc Natural Products (GLUCOSAMINE CHOND COMPLEX/MSM PO)   Multiple Vitamins-Minerals (MULTIVITAMIN WOMEN) TABS   Omega 3-6-9 Fatty Acids (OMEGA-3-6-9 PO)   Probiotic Product (PRO-BIOTIC BLEND) CAPS   sodium chloride (OCEAN) 0.65 % SOLN nasal spray   Vitamin D, Ergocalciferol, (DRISDOL) 1.25 MG (50000 UNIT) CAPS capsule   No current facility-administered medications for this encounter.    Myra Gianotti, PA-C Surgical Short Stay/Anesthesiology [facility] Phone [phone] [facility] Phone [phone] 06/22/2022 3:57 PM

## 2022-06-22 NOTE — Anesthesia Preprocedure Evaluation (Addendum)
Anesthesia Evaluation  Patient identified by MRN, date of birth, ID band Patient awake    Reviewed: Allergy & Precautions, NPO status , Patient's Chart, lab work & pertinent test results  Airway Mallampati: II  TM Distance: >3 FB Neck ROM: Full    Dental no notable dental hx.    Pulmonary neg pulmonary ROS, former smoker,    Pulmonary exam normal breath sounds clear to auscultation       Cardiovascular hypertension, Pt. on medications Normal cardiovascular exam Rhythm:Regular Rate:Normal     Neuro/Psych negative neurological ROS  negative psych ROS   GI/Hepatic negative GI ROS, Neg liver ROS,   Endo/Other  negative endocrine ROS  Renal/GU negative Renal ROS  negative genitourinary   Musculoskeletal  (+) Arthritis , Osteoarthritis,    Abdominal   Peds negative pediatric ROS (+)  Hematology negative hematology ROS (+)   Anesthesia Other Findings   Reproductive/Obstetrics negative OB ROS                            Anesthesia Physical Anesthesia Plan  ASA: 2  Anesthesia Plan: Spinal   Post-op Pain Management: Regional block*   Induction: Intravenous  PONV Risk Score and Plan: 2 and Ondansetron, Dexamethasone, Treatment may vary due to age or medical condition and Propofol infusion  Airway Management Planned: Simple Face Mask  Additional Equipment:   Intra-op Plan:   Post-operative Plan:   Informed Consent: I have reviewed the patients History and Physical, chart, labs and discussed the procedure including the risks, benefits and alternatives for the proposed anesthesia with the patient or authorized representative who has indicated his/her understanding and acceptance.     Dental advisory given  Plan Discussed with: CRNA and Surgeon  Anesthesia Plan Comments: (PAT note written 06/22/2022 by Myra Gianotti, PA-C. )       Anesthesia Quick Evaluation

## 2022-06-26 NOTE — Progress Notes (Signed)
Spoke with pt to arrive on Monday at 1130.

## 2022-06-28 ENCOUNTER — Telehealth: Payer: Self-pay | Admitting: *Deleted

## 2022-06-28 NOTE — Care Plan (Signed)
[facility] RNCM call to patient to discuss her upcoming Left total knee arthroplasty with Dr. Erlinda Hong on 06/29/22. She is an Ortho bundle patient through [facility] and is agreeable to case management. She lives alone, but has a son that will be assisting after surgery. She has a CPM, RW and 3in1/BSC already delivered by [employer] prior to surgery. Anticipate HHPT will be needed after a short hospital stay. Referral to [facility] after choice provided. Reviewed all post op care instructions. Will continue to follow for needs.

## 2022-06-28 NOTE — Telephone Encounter (Signed)
Ortho bundle pre-op call completed. 

## 2022-06-29 ENCOUNTER — Observation Stay (HOSPITAL_COMMUNITY)
Admission: RE | Admit: 2022-06-29 | Discharge: 2022-06-30 | Disposition: A | Discharge status: Home Health | Payer: PPO | Source: Ambulatory Visit | Attending: Orthopaedic Surgery | Admitting: Orthopaedic Surgery

## 2022-06-29 ENCOUNTER — Other Ambulatory Visit: Payer: Self-pay

## 2022-06-29 ENCOUNTER — Ambulatory Visit (HOSPITAL_BASED_OUTPATIENT_CLINIC_OR_DEPARTMENT_OTHER): Payer: PPO | Admitting: Anesthesiology

## 2022-06-29 ENCOUNTER — Encounter (HOSPITAL_COMMUNITY)
Admission: RE | Disposition: A | Discharge status: Home Health | Payer: Self-pay | Source: Ambulatory Visit | Attending: Orthopaedic Surgery

## 2022-06-29 ENCOUNTER — Observation Stay (HOSPITAL_COMMUNITY): Payer: PPO

## 2022-06-29 ENCOUNTER — Encounter (HOSPITAL_COMMUNITY): Payer: Self-pay | Admitting: Orthopaedic Surgery

## 2022-06-29 ENCOUNTER — Ambulatory Visit (HOSPITAL_COMMUNITY): Payer: PPO | Admitting: Vascular Surgery

## 2022-06-29 DIAGNOSIS — M1712 Unilateral primary osteoarthritis, left knee: Secondary | ICD-10-CM

## 2022-06-29 DIAGNOSIS — Z471 Aftercare following joint replacement surgery: Secondary | ICD-10-CM | POA: Diagnosis not present

## 2022-06-29 DIAGNOSIS — G8918 Other acute postprocedural pain: Secondary | ICD-10-CM | POA: Diagnosis not present

## 2022-06-29 DIAGNOSIS — I10 Essential (primary) hypertension: Secondary | ICD-10-CM | POA: Diagnosis not present

## 2022-06-29 DIAGNOSIS — Z7982 Long term (current) use of aspirin: Secondary | ICD-10-CM | POA: Diagnosis not present

## 2022-06-29 DIAGNOSIS — Z79899 Other long term (current) drug therapy: Secondary | ICD-10-CM | POA: Insufficient documentation

## 2022-06-29 DIAGNOSIS — Z96652 Presence of left artificial knee joint: Secondary | ICD-10-CM

## 2022-06-29 DIAGNOSIS — Z87891 Personal history of nicotine dependence: Secondary | ICD-10-CM | POA: Insufficient documentation

## 2022-06-29 HISTORY — PX: TOTAL KNEE ARTHROPLASTY: SHX125

## 2022-06-29 SURGERY — ARTHROPLASTY, KNEE, TOTAL
Anesthesia: Spinal | Site: Knee | Laterality: Left

## 2022-06-29 MED ORDER — METHOCARBAMOL 1000 MG/10ML IJ SOLN: 500.0000 mg | Freq: Four times a day (QID) | INTRAVENOUS | Status: DC | PRN

## 2022-06-29 MED ORDER — VANCOMYCIN HCL 1000 MG IV SOLR
INTRAVENOUS | Status: DC | PRN
  Administered 2022-06-29: 1000 mg via TOPICAL

## 2022-06-29 MED ORDER — METOCLOPRAMIDE HCL 5 MG/ML IJ SOLN: 5.0000 mg | Freq: Three times a day (TID) | INTRAMUSCULAR | Status: DC | PRN

## 2022-06-29 MED ORDER — TRANEXAMIC ACID-NACL 1000-0.7 MG/100ML-% IV SOLN
1000.0000 mg | INTRAVENOUS | Status: AC
  Administered 2022-06-29: 1000 mg via INTRAVENOUS

## 2022-06-29 MED ORDER — HYDROCHLOROTHIAZIDE 25 MG PO TABS
25.0000 mg | ORAL_TABLET | Freq: Every day | ORAL | Status: DC
  Administered 2022-06-30: 25 mg via ORAL
  Filled 2022-06-29: qty 1

## 2022-06-29 MED ORDER — HYDROCODONE-ACETAMINOPHEN 7.5-325 MG PO TABS: 1.0000 | ORAL_TABLET | ORAL | Status: DC | PRN

## 2022-06-29 MED ORDER — ORAL CARE MOUTH RINSE: 15.0000 mL | Freq: Once | OROMUCOSAL | Status: AC

## 2022-06-29 MED ORDER — CHLORHEXIDINE GLUCONATE 0.12 % MT SOLN
OROMUCOSAL | Status: AC
  Administered 2022-06-29: 15 mL via OROMUCOSAL
  Filled 2022-06-29: qty 15

## 2022-06-29 MED ORDER — LIDOCAINE 2% (20 MG/ML) 5 ML SYRINGE
INTRAMUSCULAR | Status: DC | PRN
  Administered 2022-06-29: 40 mg via INTRAVENOUS

## 2022-06-29 MED ORDER — PHENYLEPHRINE HCL-NACL 20-0.9 MG/250ML-% IV SOLN
INTRAVENOUS | Status: DC | PRN
  Administered 2022-06-29: 25 ug/min via INTRAVENOUS

## 2022-06-29 MED ORDER — TRANEXAMIC ACID-NACL 1000-0.7 MG/100ML-% IV SOLN
1000.0000 mg | Freq: Once | INTRAVENOUS | Status: AC
  Administered 2022-06-29: 1000 mg via INTRAVENOUS
  Filled 2022-06-29: qty 100

## 2022-06-29 MED ORDER — 0.9 % SODIUM CHLORIDE (POUR BTL) OPTIME
TOPICAL | Status: DC | PRN
  Administered 2022-06-29: 1000 mL

## 2022-06-29 MED ORDER — PRONTOSAN WOUND IRRIGATION OPTIME
TOPICAL | Status: DC | PRN
  Administered 2022-06-29: 1 via TOPICAL

## 2022-06-29 MED ORDER — FENTANYL CITRATE (PF) 100 MCG/2ML IJ SOLN
INTRAMUSCULAR | Status: AC
  Administered 2022-06-29: 50 ug
  Filled 2022-06-29: qty 2

## 2022-06-29 MED ORDER — MORPHINE SULFATE (PF) 2 MG/ML IV SOLN
0.5000 mg | INTRAVENOUS | Status: DC | PRN
  Administered 2022-06-29 – 2022-06-30 (×2): 1 mg via INTRAVENOUS
  Filled 2022-06-29 (×2): qty 1

## 2022-06-29 MED ORDER — ONDANSETRON HCL 4 MG PO TABS
4.0000 mg | ORAL_TABLET | Freq: Four times a day (QID) | ORAL | Status: DC | PRN
  Administered 2022-06-29 – 2022-06-30 (×2): 4 mg via ORAL
  Filled 2022-06-29 (×2): qty 1

## 2022-06-29 MED ORDER — KETOROLAC TROMETHAMINE 15 MG/ML IJ SOLN
7.5000 mg | Freq: Four times a day (QID) | INTRAMUSCULAR | Status: AC
  Administered 2022-06-29 – 2022-06-30 (×4): 7.5 mg via INTRAVENOUS
  Filled 2022-06-29 (×4): qty 1

## 2022-06-29 MED ORDER — BUPIVACAINE-MELOXICAM ER 400-12 MG/14ML IJ SOLN
INTRAMUSCULAR | Status: AC
  Filled 2022-06-29: qty 1

## 2022-06-29 MED ORDER — TRANEXAMIC ACID 1000 MG/10ML IV SOLN
INTRAVENOUS | Status: DC | PRN
  Administered 2022-06-29: 2000 mg via TOPICAL

## 2022-06-29 MED ORDER — SODIUM CHLORIDE 0.9 % IR SOLN
Status: DC | PRN
  Administered 2022-06-29: 1000 mL

## 2022-06-29 MED ORDER — DEXAMETHASONE SODIUM PHOSPHATE 10 MG/ML IJ SOLN
INTRAMUSCULAR | Status: AC
  Filled 2022-06-29: qty 1

## 2022-06-29 MED ORDER — CEFAZOLIN SODIUM-DEXTROSE 2-4 GM/100ML-% IV SOLN
2.0000 g | Freq: Four times a day (QID) | INTRAVENOUS | Status: AC
  Administered 2022-06-29 – 2022-06-30 (×2): 2 g via INTRAVENOUS
  Filled 2022-06-29 (×2): qty 100

## 2022-06-29 MED ORDER — LACTATED RINGERS IV SOLN: INTRAVENOUS | Status: DC

## 2022-06-29 MED ORDER — METHOCARBAMOL 500 MG PO TABS: 500.0000 mg | ORAL_TABLET | Freq: Four times a day (QID) | ORAL | Status: DC | PRN

## 2022-06-29 MED ORDER — PHENOL 1.4 % MT LIQD: 1.0000 | OROMUCOSAL | Status: DC | PRN

## 2022-06-29 MED ORDER — ONDANSETRON HCL 4 MG/2ML IJ SOLN: 4.0000 mg | Freq: Four times a day (QID) | INTRAMUSCULAR | Status: DC | PRN

## 2022-06-29 MED ORDER — TRANEXAMIC ACID-NACL 1000-0.7 MG/100ML-% IV SOLN
INTRAVENOUS | Status: AC
  Filled 2022-06-29: qty 100

## 2022-06-29 MED ORDER — ROPIVACAINE HCL 5 MG/ML IJ SOLN
INTRAMUSCULAR | Status: DC | PRN
  Administered 2022-06-29: 20 mL via PERINEURAL

## 2022-06-29 MED ORDER — PROPOFOL 500 MG/50ML IV EMUL
INTRAVENOUS | Status: DC | PRN
  Administered 2022-06-29: 75 ug/kg/min via INTRAVENOUS

## 2022-06-29 MED ORDER — HYDROCODONE-ACETAMINOPHEN 5-325 MG PO TABS: 1.0000 | ORAL_TABLET | ORAL | Status: DC | PRN

## 2022-06-29 MED ORDER — ORAL CARE MOUTH RINSE: 15.0000 mL | OROMUCOSAL | Status: DC | PRN

## 2022-06-29 MED ORDER — MENTHOL 3 MG MT LOZG: 1.0000 | LOZENGE | OROMUCOSAL | Status: DC | PRN

## 2022-06-29 MED ORDER — TRANEXAMIC ACID 1000 MG/10ML IV SOLN
2000.0000 mg | INTRAVENOUS | Status: DC
  Filled 2022-06-29: qty 20

## 2022-06-29 MED ORDER — DOCUSATE SODIUM 100 MG PO CAPS
100.0000 mg | ORAL_CAPSULE | Freq: Two times a day (BID) | ORAL | Status: DC
  Administered 2022-06-29 – 2022-06-30 (×2): 100 mg via ORAL
  Filled 2022-06-29 (×2): qty 1

## 2022-06-29 MED ORDER — ASPIRIN 81 MG PO CHEW
81.0000 mg | CHEWABLE_TABLET | Freq: Two times a day (BID) | ORAL | Status: DC
  Administered 2022-06-29 – 2022-06-30 (×2): 81 mg via ORAL
  Filled 2022-06-29 (×2): qty 1

## 2022-06-29 MED ORDER — ONDANSETRON HCL 4 MG/2ML IJ SOLN
INTRAMUSCULAR | Status: AC
  Filled 2022-06-29: qty 2

## 2022-06-29 MED ORDER — HYDROMORPHONE HCL 1 MG/ML IJ SOLN: 0.2500 mg | INTRAMUSCULAR | Status: DC | PRN

## 2022-06-29 MED ORDER — SODIUM CHLORIDE 0.9 % IV SOLN: INTRAVENOUS | Status: DC

## 2022-06-29 MED ORDER — PHENYLEPHRINE 80 MCG/ML (10ML) SYRINGE FOR IV PUSH (FOR BLOOD PRESSURE SUPPORT)
PREFILLED_SYRINGE | INTRAVENOUS | Status: DC | PRN
  Administered 2022-06-29: 80 ug via INTRAVENOUS

## 2022-06-29 MED ORDER — CLONIDINE HCL (ANALGESIA) 100 MCG/ML EP SOLN
EPIDURAL | Status: DC | PRN
  Administered 2022-06-29: 100 ug

## 2022-06-29 MED ORDER — BUPIVACAINE-MELOXICAM ER 400-12 MG/14ML IJ SOLN
INTRAMUSCULAR | Status: DC | PRN
  Administered 2022-06-29: 400 mg

## 2022-06-29 MED ORDER — LOSARTAN POTASSIUM 50 MG PO TABS
50.0000 mg | ORAL_TABLET | Freq: Every day | ORAL | Status: DC
  Administered 2022-06-30: 50 mg via ORAL
  Filled 2022-06-29: qty 1

## 2022-06-29 MED ORDER — FERROUS SULFATE 325 (65 FE) MG PO TABS
325.0000 mg | ORAL_TABLET | Freq: Three times a day (TID) | ORAL | Status: DC
  Administered 2022-06-29 – 2022-06-30 (×3): 325 mg via ORAL
  Filled 2022-06-29 (×3): qty 1

## 2022-06-29 MED ORDER — ONDANSETRON HCL 4 MG/2ML IJ SOLN
4.0000 mg | Freq: Once | INTRAMUSCULAR | Status: AC | PRN
  Administered 2022-06-29: 4 mg via INTRAVENOUS

## 2022-06-29 MED ORDER — PHENYLEPHRINE 80 MCG/ML (10ML) SYRINGE FOR IV PUSH (FOR BLOOD PRESSURE SUPPORT)
PREFILLED_SYRINGE | INTRAVENOUS | Status: AC
  Filled 2022-06-29: qty 10

## 2022-06-29 MED ORDER — FENTANYL CITRATE (PF) 250 MCG/5ML IJ SOLN
INTRAMUSCULAR | Status: DC | PRN
  Administered 2022-06-29: 50 ug via INTRAVENOUS

## 2022-06-29 MED ORDER — PROPOFOL 10 MG/ML IV BOLUS
INTRAVENOUS | Status: DC | PRN
  Administered 2022-06-29: 10 mg via INTRAVENOUS

## 2022-06-29 MED ORDER — POVIDONE-IODINE 10 % EX SWAB
2.0000 | Freq: Once | CUTANEOUS | Status: AC
  Administered 2022-06-29: 2 via TOPICAL

## 2022-06-29 MED ORDER — DEXAMETHASONE SODIUM PHOSPHATE 10 MG/ML IJ SOLN
10.0000 mg | Freq: Once | INTRAMUSCULAR | Status: AC
  Administered 2022-06-30: 10 mg via INTRAVENOUS
  Filled 2022-06-29: qty 1

## 2022-06-29 MED ORDER — CEFAZOLIN SODIUM-DEXTROSE 2-4 GM/100ML-% IV SOLN
INTRAVENOUS | Status: AC
  Filled 2022-06-29: qty 100

## 2022-06-29 MED ORDER — FENTANYL CITRATE (PF) 250 MCG/5ML IJ SOLN
INTRAMUSCULAR | Status: AC
  Filled 2022-06-29: qty 5

## 2022-06-29 MED ORDER — CEFAZOLIN SODIUM-DEXTROSE 2-4 GM/100ML-% IV SOLN
2.0000 g | INTRAVENOUS | Status: AC
  Administered 2022-06-29: 2 g via INTRAVENOUS

## 2022-06-29 MED ORDER — CHLORHEXIDINE GLUCONATE 0.12 % MT SOLN: 15.0000 mL | Freq: Once | OROMUCOSAL | Status: AC

## 2022-06-29 MED ORDER — METOCLOPRAMIDE HCL 5 MG PO TABS: 5.0000 mg | ORAL_TABLET | Freq: Three times a day (TID) | ORAL | Status: DC | PRN

## 2022-06-29 MED ORDER — ACETAMINOPHEN 10 MG/ML IV SOLN: 1000.0000 mg | Freq: Once | INTRAVENOUS | Status: DC | PRN

## 2022-06-29 MED ORDER — ACETAMINOPHEN 325 MG PO TABS: 325.0000 mg | ORAL_TABLET | Freq: Four times a day (QID) | ORAL | Status: DC | PRN

## 2022-06-29 MED ORDER — BUPIVACAINE IN DEXTROSE 0.75-8.25 % IT SOLN
INTRATHECAL | Status: DC | PRN
  Administered 2022-06-29: 1.6 mL via INTRATHECAL

## 2022-06-29 MED ORDER — VANCOMYCIN HCL 1000 MG IV SOLR
INTRAVENOUS | Status: AC
  Filled 2022-06-29: qty 20

## 2022-06-29 SURGICAL SUPPLY — 79 items
ALCOHOL 70% 16 OZ (MISCELLANEOUS) ×1 IMPLANT
BAG COUNTER SPONGE SURGICOUNT (BAG) IMPLANT
BAG DECANTER FOR FLEXI CONT (MISCELLANEOUS) ×1 IMPLANT
BANDAGE ESMARK 6X9 LF (GAUZE/BANDAGES/DRESSINGS) IMPLANT
BLADE SAG 18X100X1.27 (BLADE) ×1 IMPLANT
BLADE SAW SAG 90X13X1.27 (BLADE) IMPLANT
BNDG ESMARK 6X9 LF (GAUZE/BANDAGES/DRESSINGS) ×1
BOWL SMART MIX CTS (DISPOSABLE) ×1 IMPLANT
CEMENT BONE REFOBACIN R1X40 US (Cement) IMPLANT
CLSR STERI-STRIP ANTIMIC 1/2X4 (GAUZE/BANDAGES/DRESSINGS) ×2 IMPLANT
COMP FEM CEMT PERSONA STD SZ7 (Knees) ×1 IMPLANT
COMPONENT FEM CMT PRSN STD SZ7 (Knees) IMPLANT
COOLER ICEMAN CLASSIC (MISCELLANEOUS) ×1 IMPLANT
COVER SURGICAL LIGHT HANDLE (MISCELLANEOUS) ×1 IMPLANT
CUFF TOURN SGL QUICK 34 (TOURNIQUET CUFF) ×1
CUFF TOURN SGL QUICK 42 (TOURNIQUET CUFF) IMPLANT
CUFF TRNQT CYL 34X4.125X (TOURNIQUET CUFF) ×1 IMPLANT
DERMABOND ADVANCED .7 DNX12 (GAUZE/BANDAGES/DRESSINGS) ×1 IMPLANT
DRAPE EXTREMITY T 121X128X90 (DISPOSABLE) ×1 IMPLANT
DRAPE HALF SHEET 40X57 (DRAPES) ×1 IMPLANT
DRAPE INCISE IOBAN 66X45 STRL (DRAPES) ×1 IMPLANT
DRAPE ORTHO SPLIT 77X108 STRL (DRAPES) ×2
DRAPE POUCH INSTRU U-SHP 10X18 (DRAPES) ×1 IMPLANT
DRAPE SURG ORHT 6 SPLT 77X108 (DRAPES) ×2 IMPLANT
DRAPE U-SHAPE 47X51 STRL (DRAPES) ×2 IMPLANT
DRSG AQUACEL AG ADV 3.5X10 (GAUZE/BANDAGES/DRESSINGS) ×1 IMPLANT
DURAPREP 26ML APPLICATOR (WOUND CARE) ×3 IMPLANT
ELECT CAUTERY BLADE 6.4 (BLADE) ×1 IMPLANT
ELECT REM PT RETURN 9FT ADLT (ELECTROSURGICAL) ×1
ELECTRODE REM PT RTRN 9FT ADLT (ELECTROSURGICAL) ×1 IMPLANT
GLOVE BIOGEL PI IND STRL 7.0 (GLOVE) ×2 IMPLANT
GLOVE BIOGEL PI IND STRL 7.5 (GLOVE) ×5 IMPLANT
GLOVE ECLIPSE 7.0 STRL STRAW (GLOVE) ×3 IMPLANT
GLOVE SKINSENSE STRL SZ7.5 (GLOVE) ×3 IMPLANT
GLOVE SURG SYN 7.5  E (GLOVE) ×2
GLOVE SURG SYN 7.5 E (GLOVE) ×2 IMPLANT
GLOVE SURG SYN 7.5 PF PI (GLOVE) ×2 IMPLANT
GLOVE SURG UNDER LTX SZ7.5 (GLOVE) ×2 IMPLANT
GLOVE SURG UNDER POLY LF SZ7 (GLOVE) ×2 IMPLANT
GOWN STRL REIN XL XLG (GOWN DISPOSABLE) ×1 IMPLANT
GOWN STRL REUS W/ TWL LRG LVL3 (GOWN DISPOSABLE) ×1 IMPLANT
GOWN STRL REUS W/TWL LRG LVL3 (GOWN DISPOSABLE) ×1
HANDPIECE INTERPULSE COAX TIP (DISPOSABLE) ×1
HDLS TROCR DRIL PIN KNEE 75 (PIN) ×1
HOOD PEEL AWAY FLYTE STAYCOOL (MISCELLANEOUS) ×2 IMPLANT
KIT BASIN OR (CUSTOM PROCEDURE TRAY) ×1 IMPLANT
KIT TURNOVER KIT B (KITS) ×1 IMPLANT
MANIFOLD NEPTUNE II (INSTRUMENTS) ×1 IMPLANT
MARKER SKIN DUAL TIP RULER LAB (MISCELLANEOUS) ×2 IMPLANT
NDL SPNL 18GX3.5 QUINCKE PK (NEEDLE) ×1 IMPLANT
NEEDLE SPNL 18GX3.5 QUINCKE PK (NEEDLE) ×1 IMPLANT
NS IRRIG 1000ML POUR BTL (IV SOLUTION) ×1 IMPLANT
PACK TOTAL JOINT (CUSTOM PROCEDURE TRAY) ×1 IMPLANT
PAD ARMBOARD 7.5X6 YLW CONV (MISCELLANEOUS) ×2 IMPLANT
PAD COLD SHLDR WRAP-ON (PAD) ×1 IMPLANT
PIN DRILL HDLS TROCAR 75 4PK (PIN) IMPLANT
SAW OSC TIP CART 19.5X105X1.3 (SAW) ×1 IMPLANT
SCREW FEMALE HEX FIX 25X2.5 (ORTHOPEDIC DISPOSABLE SUPPLIES) IMPLANT
SET HNDPC FAN SPRY TIP SCT (DISPOSABLE) ×1 IMPLANT
STAPLER VISISTAT 35W (STAPLE) IMPLANT
STEM POLY PAT PLY 32M KNEE (Knees) IMPLANT
STEM TIBIA 5 DEG SZ E L KNEE (Knees) IMPLANT
STEM TIBIAL SZ6-7 EF 12 LT (Knees) IMPLANT
SUCTION FRAZIER HANDLE 10FR (MISCELLANEOUS) ×1
SUCTION TUBE FRAZIER 10FR DISP (MISCELLANEOUS) ×1 IMPLANT
SUT ETHILON 2 0 FS 18 (SUTURE) IMPLANT
SUT MNCRL AB 3-0 PS2 27 (SUTURE) IMPLANT
SUT VIC AB 0 CT1 27 (SUTURE) ×3
SUT VIC AB 0 CT1 27XBRD ANBCTR (SUTURE) ×2 IMPLANT
SUT VIC AB 1 CTX 27 (SUTURE) ×3 IMPLANT
SUT VIC AB 2-0 CT1 27 (SUTURE) ×6
SUT VIC AB 2-0 CT1 TAPERPNT 27 (SUTURE) ×4 IMPLANT
SYR 50ML LL SCALE MARK (SYRINGE) ×2 IMPLANT
TIBIA STEM 5 DEG SZ E L KNEE (Knees) ×1 IMPLANT
TOWEL GREEN STERILE (TOWEL DISPOSABLE) ×1 IMPLANT
TOWEL GREEN STERILE FF (TOWEL DISPOSABLE) ×1 IMPLANT
TRAY CATH 16FR W/PLASTIC CATH (SET/KITS/TRAYS/PACK) IMPLANT
UNDERPAD 30X36 HEAVY ABSORB (UNDERPADS AND DIAPERS) ×1 IMPLANT
YANKAUER SUCT BULB TIP NO VENT (SUCTIONS) ×2 IMPLANT

## 2022-06-29 NOTE — Anesthesia Procedure Notes (Signed)
Anesthesia Procedure Image    

## 2022-06-29 NOTE — Op Note (Signed)
Total Knee Arthroplasty Procedure Note  Preoperative diagnosis: Left knee osteoarthritis  Postoperative diagnosis:same  Operative findings: Bone on bone medial compartment Synovitis, large joint effusion  Operative procedure: Left total knee arthroplasty. CPT [phone]  Surgeon: N. Eduard Roux, MD  Assist: Madalyn Rob, PA-C; necessary for the timely completion of procedure and due to complexity of procedure.  Anesthesia: Spinal, regional, local  Tourniquet time: see anesthesia record  Implants used: Zimmer persona Femur: CR 7 Tibia: E Patella: 32 mm Polyethylene: 12 mm, MC  Indication: Kristy Henry is a 71 y.o. year old female with a history of knee pain. Having failed conservative management, the patient elected to proceed with a total knee arthroplasty.  We have reviewed the risk and benefits of the surgery and they elected to proceed after voicing understanding.  Procedure:  After informed consent was obtained and understanding of the risk were voiced including but not limited to bleeding, infection, damage to surrounding structures including nerves and vessels, blood clots, leg length inequality and the failure to achieve desired results, the operative extremity was marked with verbal confirmation of the patient in the holding area.   The patient was then brought to the operating room and transported to the operating room table in the supine position.  A tourniquet was applied to the operative extremity around the upper thigh. The operative limb was then prepped and draped in the usual sterile fashion and preoperative antibiotics were administered.  A time out was performed prior to the start of surgery confirming the correct extremity, preoperative antibiotic administration, as well as team members, implants and instruments available for the case. Correct surgical site was also confirmed with preoperative radiographs. The limb was then elevated for  exsanguination and the tourniquet was inflated. A midline incision was made and a standard medial parapatellar approach was performed.  The infrapatellar fat pad was removed.  Suprapatellar synovium was removed to reveal the anterior distal femoral cortex.  A medial peel was performed to release the capsule of the medial tibial plateau.  The patella was then everted and was prepared and sized to a 32 mm.  A cover was placed on the patella for protection from retractors.  The knee was then brought into full flexion and we then turned our attention to the femur.  The cruciates were sacrificed.  Start site was drilled in the femur and the intramedullary distal femoral cutting guide was placed, set at 5 degrees valgus, taking 10 mm of distal resection. The distal cut was made. Osteophytes were then removed.  Next, the proximal tibial cutting guide was placed with appropriate slope, varus/valgus alignment and depth of resection. The proximal tibial cut was made taking 2 mm off the low side. Gap blocks were then used to assess the extension gap and alignment, and appropriate soft tissue releases were performed. Attention was turned back to the femur, which was sized using the sizing guide to a size 7 standard. Appropriate rotation of the femoral component was determined using epicondylar axis, Whiteside's line, and assessing the flexion gap under ligament tension. The appropriate size 4-in-1 cutting block was placed and checked with an angel wing and cuts were made. Posterior femoral osteophytes and uncapped bone were then removed with the curved osteotome.  Trial components were placed, and stability was checked in full extension, mid-flexion, and deep flexion. Proper tibial rotation was determined and marked.  The patella tracked well without a lateral release.  The femoral lugs were then drilled. Trial components were  then removed and tibial preparation performed.  The tibia was sized for a size E component.   The  bony surfaces were irrigated with a pulse lavage and then dried. Bone cement was vacuum mixed on the back table, and the final components sized above were cemented into place.  Antibiotic irrigation was placed in the knee joint and soft tissues while the cement cured.  After cement had finished curing, excess cement was removed. The stability of the construct was re-evaluated throughout a range of motion and found to be acceptable. The trial liner was removed, the knee was copiously irrigated, and the knee was re-evaluated for any excess bone debris. The real polyethylene liner, 12 mm thick, was inserted and checked to ensure the locking mechanism had engaged appropriately. The tourniquet was deflated and hemostasis was achieved. The wound was irrigated with normal saline.  One gram of vancomycin powder was placed in the surgical bed.  Topical 0.25% bupivacaine and meloxicam was placed in the joint for postoperative pain.  Capsular closure was performed with a #1 vicryl, subcutaneous fat closed with a 0 vicryl suture, then subcutaneous tissue closed with interrupted 2.0 vicryl suture. The skin was then closed with a 2.0 nylon and dermabond. A sterile dressing was applied.  The patient was awakened in the operating room and taken to recovery in stable condition. All sponge, needle, and instrument counts were correct at the end of the case.  [name] was necessary for opening, closing, retracting, limb positioning and overall facilitation and completion of the surgery.  Position: supine  Complications: none.  Time Out: performed   Drains/Packing: none  Estimated blood loss: minimal  Returned to Recovery Room: in good condition.   Antibiotics: yes   Mechanical VTE (DVT) Prophylaxis: sequential compression devices, TED thigh-high  Chemical VTE (DVT) Prophylaxis: aspirin  Fluid Replacement  Crystalloid: see anesthesia record Blood: none  FFP: none   Specimens Removed: 1 to pathology    Sponge and Instrument Count Correct? yes   PACU: portable radiograph - knee AP and Lateral   Plan/RTC: Return in 2 weeks for wound check.   Weight Bearing/Load Lower Extremity: full   Implant Name Type Inv. Item Serial No. Manufacturer Lot No. LRB No. Used Action  CEMENT BONE REFOBACIN [id number] [place] - [id number] Cement CEMENT BONE REFOBACIN [id number] [place]  ZIMMER RECON(ORTH,TRAU,BIO,SG) [id number] Left 2 Implanted  TIBIA STEM 5 DEG SZ E L KNEE - [id number] Knees TIBIA STEM 5 DEG SZ E L KNEE  ZIMMER RECON(ORTH,TRAU,BIO,SG) [id number] Left 1 Implanted  COMP FEM CEMT PERSONA STD SZ7 - [id number] Knees COMP FEM CEMT PERSONA STD SZ7  ZIMMER RECON(ORTH,TRAU,BIO,SG) [id number] Left 1 Implanted  STEM POLY PAT PLY 36M KNEE - [id number] Knees STEM POLY PAT PLY 36M KNEE  ZIMMER RECON(ORTH,TRAU,BIO,SG) [id number] Left 1 Implanted  STEM TIBIAL SZ6-7 EF 12 LT - [id number] Knees STEM TIBIAL SZ6-7 EF 12 LT  ZIMMER RECON(ORTH,TRAU,BIO,SG) [id number] Left 1 Implanted    N. Eduard Roux, MD [facility] 8:03 PM

## 2022-06-29 NOTE — H&P (Signed)
PREOPERATIVE H&P  Chief Complaint: left knee degenerative joint disease  HPI: Kristy Henry is a 71 y.o. female who presents for surgical treatment of left knee degenerative joint disease.  She denies any changes in medical history.  Past Medical History:  Diagnosis Date   Anxiety    Arthritis    Depression    High cholesterol    HLD (hyperlipidemia)    Hypertension    Pneumonia    Primary sleep disturbance    Syncope and collapse 2019   Vision abnormalities    Past Surgical History:  Procedure Laterality Date   ABDOMINAL HYSTERECTOMY     BONE EXCISION     from bilateral hands   BREAST BIOPSY Right 09/25/2016   eye lid surgery     RECTOCELE REPAIR     Social History   Socioeconomic History   Marital status: Widowed    Spouse name: Not on file   Number of children: 3   Years of education: Not on file   Highest education level: High school graduate  Occupational History   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  Other Topics Concern   Not on file  Social History Narrative   Not on file   Social Determinants of Health   Financial Resource Strain: Not on file  Food Insecurity: Not on file  Transportation Needs: Not on file  Physical Activity: Not on file  Stress: Not on file  Social Connections: Not on file   Family History  Problem Relation Age of Onset   Heart disease Mother    Cancer Father    Colon cancer Father    Breast cancer Sister 107   High blood pressure Sister    Diabetes type II Brother    Diabetes type II Brother    Allergies  Allergen Reactions   Meclizine Shortness Of Breath and Nausea Only    Chills, Sweats, Dizzy, Burning in ears, nose and throat, headache,  extreme tiredness   Atenolol     Over sedated - made pt feel too sleepy   Clonidine Derivatives     Pt does not remember reaction    Hydrocodone     Suicide Ideation / Depression    Macrobid [Nitrofurantoin]     Pt does not remember reaction    Oxycodone Nausea And Vomiting    SEVERE   Venlafaxine     Could not function normally    Prior to Admission medications   Medication Sig Start Date End Date Taking? Authorizing Provider  atorvastatin (LIPITOR) 10 MG tablet Take 10 mg by mouth daily.   Yes [provider]  Barberry-Oreg Grape-Goldenseal (BERBERINE COMPLEX PO) Take 2 capsules by mouth daily.   Yes [provider]  Cyanocobalamin (B-12) 5000 MCG SUBL Place 5,000 mcg under the tongue daily.   Yes [provider]  hydrochlorothiazide (HYDRODIURIL) 25 MG tablet Take 25 mg by mouth daily.   Yes [provider]  losartan (COZAAR) 50 MG tablet Take 50 mg by mouth daily. 05/15/22  Yes [provider]  MAGNESIUM PO Take 1 tablet by mouth at bedtime. With Ashwagandha   Yes [provider]  melatonin  1 MG TABS tablet Take 1 mg by mouth at bedtime. With Ginger   Yes [provider]  Misc Natural Products (GLUCOSAMINE CHOND COMPLEX/MSM PO) Take 2 tablets by mouth daily.   Yes [provider]  Multiple Vitamins-Minerals (MULTIVITAMIN WOMEN) TABS Take 1 tablet by mouth daily.   Yes [provider]  Omega 3-6-9 Fatty Acids (OMEGA-3-6-9 PO) Take 1 capsule by mouth daily. Vegan   Yes [provider]  Probiotic Product (PRO-BIOTIC BLEND) CAPS Take 1 capsule by mouth daily as needed (Digestive Health). 2 Billion CFUs   Yes [provider]  sodium chloride (OCEAN) 0.65 % SOLN nasal spray Place 1 spray into both nostrils as needed for congestion.   Yes [provider]  Vitamin D, Ergocalciferol, (DRISDOL) 1.25 MG (50000 UNIT) CAPS capsule Take 50,000 Units by mouth every Sunday.   Yes [provider]  aspirin EC 81 MG tablet Take 1 tablet (81 mg total) by mouth 2 (two) times daily. To be taken after surgery 06/22/22 06/22/23  Aundra Dubin, PA-C  docusate sodium (COLACE) 100 MG capsule Take 1 capsule (100 mg total) by mouth daily as needed. 06/22/22 06/22/23  Aundra Dubin, PA-C  HYDROmorphone (DILAUDID) 2 MG tablet Take 1 tablet (2 mg total) by mouth every 6 (six) hours as needed for severe pain. To be taken after surgery 06/22/22   Aundra Dubin, PA-C  methocarbamol (ROBAXIN-750) 750 MG tablet Take 1 tablet (750 mg total) by mouth 2 (two) times daily as needed for muscle spasms. 06/22/22   Aundra Dubin, PA-C  ondansetron (ZOFRAN) 4 MG tablet Take 1 tablet (4 mg total) by mouth every 8 (eight) hours as needed for nausea or vomiting. 06/22/22   Aundra Dubin, PA-C     Positive ROS: All other systems have been reviewed and were otherwise negative with the exception of those mentioned in the HPI and as above.  Physical Exam: General: Alert, no acute distress Cardiovascular: No pedal edema Respiratory: No cyanosis, no use of accessory musculature GI: abdomen soft Skin: No lesions in the area of chief complaint Neurologic: Sensation intact distally Psychiatric: Patient is competent for consent with normal mood and affect Lymphatic: no lymphedema  MUSCULOSKELETAL: exam stable  Assessment: left knee degenerative joint disease  Plan: Plan for Procedure(s): LEFT TOTAL KNEE ARTHROPLASTY  The risks benefits and alternatives were discussed with the patient including but not limited to the risks of nonoperative treatment, versus surgical intervention including infection, bleeding, nerve injury,  blood clots, cardiopulmonary complications, morbidity, mortality, among others, and they were willing to proceed.   Eduard Roux, MD 06/29/2022 11:51 AM

## 2022-06-29 NOTE — Anesthesia Procedure Notes (Signed)
Anesthesia Regional Block: Adductor canal block   Pre-Anesthetic Checklist: , timeout performed,  Correct Patient, Correct Site, Correct Laterality,  Correct Procedure, Correct Position, site marked,  Risks and benefits discussed,  Surgical consent,  Pre-op evaluation,  At surgeon's request and post-op pain management  Laterality: Left  Prep: chloraprep       Needles:  Injection technique: Single-shot  Needle Type: Echogenic Needle     Needle Length: 9cm      Additional Needles:   Procedures:,,,, ultrasound used (permanent image in chart),,    Narrative:  Start time: 06/29/2022 12:50 PM End time: 06/29/2022 12:56 PM Injection made incrementally with aspirations every 5 mL.  Performed by: Personally  Anesthesiologist: Myrtie Soman, MD  Additional Notes: Patient tolerated the procedure well without complications

## 2022-06-29 NOTE — Transfer of Care (Signed)
Immediate Anesthesia Transfer of Care Note  Patient: Kristy Henry  Procedure(s) Performed: LEFT TOTAL KNEE ARTHROPLASTY (Left: Knee)  Patient Location: PACU  Anesthesia Type:Regional and Spinal  Level of Consciousness: awake, alert  and oriented  Airway & Oxygen Therapy: Patient Spontanous Breathing and Patient connected to nasal cannula oxygen  Post-op Assessment: Report given to RN and Post -op Vital signs reviewed and stable  Post vital signs: Reviewed and stable  Last Vitals:  Vitals Value Taken Time  BP 126/77 06/29/22 1623  Temp    Pulse 77 06/29/22 1626  Resp 15 06/29/22 1626  SpO2 100 % 06/29/22 1626  Vitals shown include unvalidated device data.  Last Pain:  Vitals:   06/29/22 1151  TempSrc: Oral         Complications: No notable events documented.

## 2022-06-29 NOTE — Anesthesia Procedure Notes (Signed)
Spinal  Patient location during procedure: OR Start time: 06/29/2022 2:18 PM End time: 06/29/2022 2:22 PM Reason for block: surgical anesthesia Staffing Performed: anesthesiologist  Anesthesiologist: Myrtie Soman, MD Performed by: Myrtie Soman, MD Authorized by: Myrtie Soman, MD   Preanesthetic Checklist Completed: patient identified, IV checked, site marked, risks and benefits discussed, surgical consent, monitors and equipment checked, pre-op evaluation and timeout performed Spinal Block Patient position: sitting Prep: Betadine Patient monitoring: heart rate, continuous pulse ox and blood pressure Approach: midline Location: L3-4 Injection technique: single-shot Needle Needle type: Sprotte  Needle gauge: 24 G Needle length: 9 cm Assessment Sensory level: T6 Events: CSF return Additional Notes

## 2022-06-29 NOTE — Discharge Instructions (Signed)

## 2022-06-30 ENCOUNTER — Encounter (HOSPITAL_COMMUNITY): Payer: Self-pay | Admitting: Orthopaedic Surgery

## 2022-06-30 DIAGNOSIS — M1712 Unilateral primary osteoarthritis, left knee: Secondary | ICD-10-CM | POA: Diagnosis not present

## 2022-06-30 DIAGNOSIS — Z96652 Presence of left artificial knee joint: Secondary | ICD-10-CM | POA: Diagnosis not present

## 2022-06-30 LAB — CBC
HCT: 33.2 % — ABNORMAL LOW (ref 36.0–46.0)
Hemoglobin: 11.5 g/dL — ABNORMAL LOW (ref 12.0–15.0)
MCH: 30.5 pg (ref 26.0–34.0)
MCHC: 34.6 g/dL (ref 30.0–36.0)
MCV: 88.1 fL (ref 80.0–100.0)
Platelets: 252 10*3/uL (ref 150–400)
RBC: 3.77 MIL/uL — ABNORMAL LOW (ref 3.87–5.11)
RDW: 13.9 % (ref 11.5–15.5)
WBC: 13 10*3/uL — ABNORMAL HIGH (ref 4.0–10.5)
nRBC: 0 % (ref 0.0–0.2)

## 2022-06-30 NOTE — Anesthesia Postprocedure Evaluation (Signed)
Anesthesia Post Note  Patient: Kristy Henry  Procedure(s) Performed: LEFT TOTAL KNEE ARTHROPLASTY (Left: Knee)     Patient location during evaluation: PACU Anesthesia Type: Spinal Level of consciousness: oriented and awake and alert Pain management: pain level controlled Vital Signs Assessment: post-procedure vital signs reviewed and stable Respiratory status: spontaneous breathing, respiratory function stable and patient connected to nasal cannula oxygen Cardiovascular status: blood pressure returned to baseline and stable Postop Assessment: no headache, no backache and no apparent nausea or vomiting Anesthetic complications: no   No notable events documented.  Last Vitals:  Vitals:   06/30/22 0521 06/30/22 0747  BP: (!) 145/62 (!) 155/61  Pulse: 75 71  Resp: 17   Temp:  36.7 C  SpO2: 99% 99%    Last Pain:  Vitals:   06/30/22 0747  TempSrc: Oral  PainSc:                  [name]

## 2022-06-30 NOTE — Progress Notes (Signed)
Subjective: 1 Day Post-Op Procedure(s) (LRB): LEFT TOTAL KNEE ARTHROPLASTY (Left) Patient reports pain as mild.    Objective: Vital signs in last 24 hours: Temp:  [97 F (36.1 C)-98.2 F (36.8 C)] 98.1 F (36.7 C) (09/19 0747) Pulse Rate:  [67-94] 71 (09/19 0747) Resp:  [15-20] 17 (09/19 0521) BP: (88-162)/(51-89) 155/61 (09/19 0747) SpO2:  [96 %-100 %] 99 % (09/19 0747) Weight:  [78 kg] 78 kg (09/18 1932)  Intake/Output from previous day: 09/18 0701 - 09/19 0700 In: 1926.3 [P.O.:120; I.V.:1306.3; IV Piggyback:500] Out: 1000 [Urine:1000] Intake/Output this shift: No intake/output data recorded.  Recent Labs    06/30/22 0132  HGB 11.5*   Recent Labs    06/30/22 0132  WBC 13.0*  RBC 3.77*  HCT 33.2*  PLT 252   No results for input(s): "NA", "K", "CL", "CO2", "BUN", "CREATININE", "GLUCOSE", "CALCIUM" in the last 72 hours. No results for input(s): "LABPT", "INR" in the last 72 hours.  Neurologically intact Neurovascular intact Sensation intact distally Intact pulses distally Dorsiflexion/Plantar flexion intact Incision: scant drainage No cellulitis present Compartment soft   Assessment/Plan: 1 Day Post-Op Procedure(s) (LRB): LEFT TOTAL KNEE ARTHROPLASTY (Left) Advance diet Up with therapy D/C IV fluids Discharge home with home health once cleared by PT.  Son will be helping out today and possibly tomorrow at home, but his girlfriend will likely be able to work from home thereafter.   WBAT LLE ABLA- mild and stable   Anticipated LOS equal to or greater than 2 midnights due to - Age 72 and older with one or more of the following:  - Obesity  - Expected need for hospital services (PT, OT, Nursing) required for safe  discharge  - Anticipated need for postoperative skilled nursing care or inpatient rehab  - Active co-morbidities: None OR   - Unanticipated findings during/Post Surgery: None  - Patient is a high risk of re-admission due to: None   Aundra Dubin 06/30/2022, 8:11 AM

## 2022-06-30 NOTE — Discharge Summary (Signed)
Patient ID: Kristy Henry MRN: [id number] DOB/AGE: 1951/06/08 71 y.o.  Admit date: 06/29/2022 Discharge date: 06/30/2022  Admission Diagnoses:  Principal Problem:   Primary osteoarthritis of left knee Active Problems:   Status post total left knee replacement   Discharge Diagnoses:  Same  Past Medical History:  Diagnosis Date   Anxiety    Arthritis    Depression    High cholesterol    HLD (hyperlipidemia)    Hypertension    Pneumonia    Primary sleep disturbance    Syncope and collapse 2019   Vision abnormalities     Surgeries: Procedure(s): LEFT TOTAL KNEE ARTHROPLASTY on 06/29/2022   Consultants:   Discharged Condition: Improved  Hospital Course: Kristy Henry is an 71 y.o. female who was admitted 06/29/2022 for operative treatment ofPrimary osteoarthritis of left knee. Patient has severe unremitting pain that affects sleep, daily activities, and work/hobbies. After pre-op clearance the patient was taken to the operating room on 06/29/2022 and underwent  Procedure(s): LEFT TOTAL KNEE ARTHROPLASTY.    Patient was given perioperative antibiotics:  Anti-infectives (From admission, onward)    Start     Dose/Rate Route Frequency Ordered Stop   06/29/22 2030  ceFAZolin (ANCEF) IVPB 2g/100 mL premix        2 g 200 mL/hr over 30 Minutes Intravenous Every 6 hours 06/29/22 1805 06/30/22 0239   06/29/22 1532  vancomycin (VANCOCIN) powder  Status:  Discontinued          As needed 06/29/22 1532 06/29/22 1619   06/29/22 1145  ceFAZolin (ANCEF) IVPB 2g/100 mL premix        2 g 200 mL/hr over 30 Minutes Intravenous On call to O.R. 06/29/22 1136 06/29/22 1453   06/29/22 1140  ceFAZolin (ANCEF) 2-4 GM/100ML-% IVPB       Note to Pharmacy: Leonides Sake: cabinet override      06/29/22 1140 06/29/22 1431        Patient was given sequential compression devices, early ambulation, and chemoprophylaxis to prevent DVT.  Patient benefited maximally from hospital stay and  there were no complications.    Recent vital signs: Patient Vitals for the past 24 hrs:  BP Temp Temp src Pulse Resp SpO2 Height Weight  06/30/22 0747 (!) 155/61 98.1 F (36.7 C) Oral 71 -- 99 % -- --  06/30/22 0521 (!) 145/62 -- -- 75 17 99 % -- --  06/29/22 1949 (!) 114/51 98.2 F (36.8 C) -- 67 17 100 % -- --  06/29/22 1933 -- -- -- -- -- -- '5\' 2"'$  (1.575 m) --  06/29/22 1932 -- -- -- -- -- -- -- 78 kg  06/29/22 1758 (!) 141/76 97.6 F (36.4 C) Oral 83 18 100 % -- --  06/29/22 1715 (!) 142/73 98 F (36.7 C) -- 73 20 99 % -- --  06/29/22 1700 (!) 145/89 -- -- 78 16 96 % -- --  06/29/22 1645 125/78 -- -- 71 16 100 % -- --  06/29/22 1630 106/77 -- -- 77 17 96 % -- --  06/29/22 1623 126/77 (!) 97 F (36.1 C) -- 76 16 100 % -- --  06/29/22 1300 109/64 -- -- 73 15 98 % -- --  06/29/22 1255 (!) 88/72 -- -- 80 20 99 % -- --  06/29/22 1253 (!) 157/66 -- -- 79 15 100 % -- --  06/29/22 1250 (!) 162/63 -- -- 82 20 100 % -- --  06/29/22 1151 (!) 154/65 97.6 F (36.4 C)  Oral 94 18 99 % -- --     Recent laboratory studies:  Recent Labs    06/30/22 0132  WBC 13.0*  HGB 11.5*  HCT 33.2*  PLT 252     Discharge Medications:   Allergies as of 06/30/2022       Reactions   Meclizine Shortness Of Breath, Nausea Only   Chills, Sweats, Dizzy, Burning in ears, nose and throat, headache, extreme tiredness   Hydrocodone    Suicide Ideation / Depression    Oxycodone Nausea And Vomiting   SEVERE   Clonidine Derivatives    Pt does not remember reaction    Macrobid [nitrofurantoin]    Pt does not remember reaction    Atenolol Other (See Comments)   Over sedated - made pt feel too sleepy   Venlafaxine Other (See Comments)   Could not function normally         Medication List     STOP taking these medications    OMEGA-3-6-9 PO       TAKE these medications    aspirin EC 81 MG tablet Take 1 tablet (81 mg total) by mouth 2 (two) times daily. To be taken after surgery    atorvastatin 10 MG tablet Commonly known as: LIPITOR Take 10 mg by mouth daily.   B-12 5000 MCG Subl Place 5,000 mcg under the tongue daily.   BERBERINE COMPLEX PO Take 2 capsules by mouth daily.   docusate sodium 100 MG capsule Commonly known as: Colace Take 1 capsule (100 mg total) by mouth daily as needed.   GLUCOSAMINE CHOND COMPLEX/MSM PO Take 2 tablets by mouth daily.   hydrochlorothiazide 25 MG tablet Commonly known as: HYDRODIURIL Take 25 mg by mouth daily.   HYDROmorphone 2 MG tablet Commonly known as: Dilaudid Take 1 tablet (2 mg total) by mouth every 6 (six) hours as needed for severe pain. To be taken after surgery   losartan 50 MG tablet Commonly known as: COZAAR Take 50 mg by mouth daily.   MAGNESIUM PO Take 1 tablet by mouth at bedtime. With Ashwagandha   melatonin 1 MG Tabs tablet Take 1 mg by mouth at bedtime. With Ginger   methocarbamol 750 MG tablet Commonly known as: Robaxin-750 Take 1 tablet (750 mg total) by mouth 2 (two) times daily as needed for muscle spasms.   Multivitamin Women Tabs Take 1 tablet by mouth daily.   ondansetron 4 MG tablet Commonly known as: Zofran Take 1 tablet (4 mg total) by mouth every 8 (eight) hours as needed for nausea or vomiting.   Pro-biotic Blend Caps Take 1 capsule by mouth daily as needed (Digestive Health). 2 Billion CFUs   sodium chloride 0.65 % Soln nasal spray Commonly known as: OCEAN Place 1 spray into both nostrils as needed for congestion.   Vitamin D (Ergocalciferol) 1.25 MG (50000 UNIT) Caps capsule Commonly known as: DRISDOL Take 50,000 Units by mouth every 'Sunday.               Durable Medical Equipment  (From admission, onward)           Start     Ordered   06/29/22 1806  DME Walker rolling  Once       Question Answer Comment  Walker: With 5 Inch Wheels   Patient needs a walker to treat with the following condition Status post left partial knee replacement      09'$ /18/23  1805   06/29/22 1806  DME 3 n 1  Once        06/29/22 1805   06/29/22 1806  DME Bedside commode  Once       Question:  Patient needs a bedside commode to treat with the following condition  Answer:  Status post left partial knee replacement   06/29/22 1805            Diagnostic Studies: DG Knee Left Port  Result Date: 06/29/2022 CLINICAL DATA:  Postop. EXAM: PORTABLE LEFT KNEE - 1-2 VIEW COMPARISON:  Preoperative radiograph 06/03/2022 FINDINGS: Left knee arthroplasty in expected alignment. No periprosthetic lucency or fracture. There has been patellar resurfacing. Recent postsurgical change includes air and edema in the soft tissues and joint space. IMPRESSION: Left knee arthroplasty without immediate postoperative complication. Electronically Signed   By: Keith Rake M.D.   On: 06/29/2022 17:25   XR KNEE 3 VIEW LEFT  Result Date: 06/03/2022 Symmetric femoral-tibial joint space narrowing greater than 50%.  Tricompartmental degenerative joint disease.   Disposition: Discharge disposition: 01-Home or Self Care          Follow-up Information     Leandrew Koyanagi, MD. Schedule an appointment as soon as possible for a visit in 2 week(s).   Specialty: Orthopedic Surgery Contact information: [address] [place] [address] [phone]                  Signed: Aundra Dubin 06/30/2022, 8:13 AM

## 2022-06-30 NOTE — Progress Notes (Signed)
Physical Therapy Treatment Patient Details Name: Kristy Henry MRN: [id number] DOB: Jul 19, 1951 Today's Date: 06/30/2022   History of Present Illness Pt is a 71 year old female s/p L TKA 06/29/22; PMH significant for former smoker, HTN, HLD, anxiety, depression, syncope (08/2018, unremarkable cardiac evaluation per Dr. Einar Gip    PT Comments    Pt with increased activity tolerance and ambulation distance this session; overall improved nausea. Pt ambulating a total of 150 ft with a walker, utilizing a step to pattern. Negotiated 1 step with RW to simulate home entrance. Visually demonstrated car transfer technique. Pt with no further questions/concerns. Adequate for d/c home from PT perspective.   Recommendations for follow up therapy are one component of a multi-disciplinary discharge planning process, led by the attending physician.  Recommendations may be updated based on patient status, additional functional criteria and insurance authorization.  Follow Up Recommendations  Home health PT     Assistance Recommended at Discharge PRN  Patient can return home with the following A little help with walking and/or transfers;A little help with bathing/dressing/bathroom;Assistance with cooking/housework;Assist for transportation;Help with stairs or ramp for entrance   Equipment Recommendations  None recommended by PT (pt already equipped)    Recommendations for Other Services       Precautions / Restrictions Precautions Precautions: Fall;Knee Restrictions Weight Bearing Restrictions: No     Mobility  Bed Mobility Overal bed mobility: Modified Independent             General bed mobility comments: Pt in recliner upon entry    Transfers Overall transfer level: Needs assistance Equipment used: Rolling walker (2 wheels) Transfers: Sit to/from Stand Sit to Stand: Supervision           General transfer comment: Cues for hand placement     Ambulation/Gait Ambulation/Gait assistance: Min guard Gait Distance (Feet): 150 Feet (50", 100") Assistive device: Rolling walker (2 wheels) Gait Pattern/deviations: Step-to pattern, Decreased stance time - left, Decreased weight shift to left, Antalgic Gait velocity: decreased Gait velocity interpretation: <1.31 ft/sec, indicative of household ambulator   General Gait Details: Cues for sequencing/technique, progressive WB through LLE, heel strike at initial contact, decreased L step length   Stairs Stairs: Yes Stairs assistance: Min guard Stair Management: No rails, With walker Number of Stairs: 1 General stair comments: Cues for sequencing/technique with RW   Wheelchair Mobility    Modified Rankin (Stroke Patients Only)       Balance Overall balance assessment: Needs assistance Sitting-balance support: Feet supported Sitting balance-Leahy Scale: Good     Standing balance support: Bilateral upper extremity supported, During functional activity Standing balance-Leahy Scale: Fair Standing balance comment: reliant through RW                            Cognition Arousal/Alertness: Awake/alert Behavior During Therapy: WFL for tasks assessed/performed Overall Cognitive Status: Within Functional Limits for tasks assessed                                          Exercises Total Joint Exercises Ankle Circles/Pumps: Both, 5 reps, Supine Quad Sets: Left, 10 reps, Supine Short Arc Quad: Left, 10 reps, Supine Heel Slides: Left, 10 reps, Supine Hip ABduction/ADduction: Left, 5 reps, Supine Long Arc Quad: Left, 5 reps, Seated Knee Flexion: Left, 5 reps, Seated Goniometric ROM: 8-85 degrees    General  Comments General comments (skin integrity, edema, etc.): no c/o nausea/dizziness throughout evaluation      Pertinent Vitals/Pain Pain Assessment Pain Assessment: Faces Faces Pain Scale: Hurts little more Pain Location: L knee Pain  Descriptors / Indicators: Aching, Discomfort, Grimacing Pain Intervention(s): Monitored during session    Home Living Family/patient expects to be discharged to:: Private residence Living Arrangements: Alone Available Help at Discharge: Family;Available PRN/intermittently (son who works, son girlfriend works from home and can assist per pt report) Type of Home: House Home Access: Stairs to enter   Technical brewer of Steps: 1   Home Layout: One level Home Equipment: Conservation officer, nature (2 wheels);BSC/3in1      Prior Function            PT Goals (current goals can now be found in the care plan section) Acute Rehab PT Goals Patient Stated Goal: go home Potential to Achieve Goals: Good Progress towards PT goals: Progressing toward goals    Frequency    7X/week      PT Plan Current plan remains appropriate    Co-evaluation              AM-PAC PT "6 Clicks" Mobility   Outcome Measure  Help needed turning from your back to your side while in a flat bed without using bedrails?: None Help needed moving from lying on your back to sitting on the side of a flat bed without using bedrails?: None Help needed moving to and from a bed to a chair (including a wheelchair)?: A Little Help needed standing up from a chair using your arms (e.g., wheelchair or bedside chair)?: A Little Help needed to walk in hospital room?: A Little Help needed climbing 3-5 steps with a railing? : A Little 6 Click Score: 20    End of Session Equipment Utilized During Treatment: Gait belt Activity Tolerance: Patient tolerated treatment well Patient left: in chair;with call bell/phone within reach Nurse Communication: Mobility status PT Visit Diagnosis: Other abnormalities of gait and mobility (R26.89);Difficulty in walking, not elsewhere classified (R26.2);Pain Pain - Right/Left: Left Pain - part of body: Knee     Time: 5883-2549 PT Time Calculation (min) (ACUTE ONLY): 21 min  Charges:   $Gait Training: 8-22 mins                     Wyona Almas, PT, DPT Acute Rehabilitation Services Office [phone]    Deno Etienne 06/30/2022, 1:10 PM

## 2022-06-30 NOTE — TOC Transition Note (Signed)
Transition of Care [facility]) - CM/SW Discharge Note   Patient Details  Name: Meah Jiron MRN: [id number] Date of Birth: January 15, 1951  Transition of Care [facility]) CM/SW Contact:  Sharin Mons, RN Phone Number: 06/30/2022, 10:03 AM   Clinical Narrative:     Patient will DC to: home Anticipated DC date: 06/30/2022 Family notified: yes Transport by: car          - s/p L TKA 06/29/2022 Per MD patient ready for DC today pending therapy clearance . RN, patient, patient's family, and [place] notified of potential DC.  Pt states son and son's girlfriend will assist with care once d/c .  Pt with post hospital f/u noted on AVS.  Pt without Rx MED concern. DME needs : RW, CPM , BSC, pt has @ home .  Son to provide transportation to home.  RNCM will sign off for now as intervention is no longer needed. Please consult [place] again if new needs arise.   Final next level of care: [place] Barriers to Discharge: No Barriers Identified   Patient Goals and CMS Choice        Discharge Placement                       Discharge Plan and Services                          HH Arranged: PT HH Agency: [place] Date [place]: 06/30/22 Time [place]: 9532 Representative spoke with at [place]: Claiborne Billings  Social Determinants of Health ([place]) Interventions     Readmission Risk Interventions     No data to display

## 2022-06-30 NOTE — Evaluation (Signed)
Physical Therapy Evaluation Patient Details Name: Kristy Henry MRN: [id number] DOB: 1951-05-12 Today's Date: 06/30/2022  History of Present Illness  Pt is a 71 y.o. F who presents s/p L TKA 06/29/2022. Significant PMH: none.  Clinical Impression  PTA, pt lives alone and is independent. Pt reports her son and son's girlfriend will be available to assist initially. Pt presents with left lower extremity weakness, decreased ROM, gait abnormalities and impaired standing balance. Pt ambulating ~10 ft with a walker at a min guard assist level. Further distance limited due to pt reporting nausea and feeling faint. Symptoms resided with seat reclined and cool washcloth; RN notified for nausea medication. Will continue to progress as tolerated.       Recommendations for follow up therapy are one component of a multi-disciplinary discharge planning process, led by the attending physician.  Recommendations may be updated based on patient status, additional functional criteria and insurance authorization.  Follow Up Recommendations Home health PT      Assistance Recommended at Discharge PRN  Patient can return home with the following  A little help with walking and/or transfers;A little help with bathing/dressing/bathroom;Assistance with cooking/housework;Assist for transportation;Help with stairs or ramp for entrance    Equipment Recommendations None recommended by PT (pt already equipped)  Recommendations for Other Services       Functional Status Assessment Patient has had a recent decline in their functional status and demonstrates the ability to make significant improvements in function in a reasonable and predictable amount of time.     Precautions / Restrictions Precautions Precautions: Fall Restrictions Weight Bearing Restrictions: No      Mobility  Bed Mobility Overal bed mobility: Modified Independent             General bed mobility comments: HOB slightly elevated, pt  manuevering LLE with arms off side of bed    Transfers Overall transfer level: Needs assistance Equipment used: Rolling walker (2 wheels) Transfers: Sit to/from Stand Sit to Stand: Min guard           General transfer comment: Close min guard, cues provided for hand placement    Ambulation/Gait Ambulation/Gait assistance: Min guard Gait Distance (Feet): 10 Feet Assistive device: Rolling walker (2 wheels) Gait Pattern/deviations: Step-to pattern, Decreased stance time - left, Decreased weight shift to left, Antalgic Gait velocity: decreased Gait velocity interpretation: <1.31 ft/sec, indicative of household ambulator   General Gait Details: Cues for sequencing/technique, pt with limited weightbearing through LLE. Distance limited due to pt feeling faint/nauseous  Stairs            Wheelchair Mobility    Modified Rankin (Stroke Patients Only)       Balance Overall balance assessment: Needs assistance Sitting-balance support: Feet supported Sitting balance-Leahy Scale: Good     Standing balance support: Bilateral upper extremity supported Standing balance-Leahy Scale: Poor Standing balance comment: reliant through RW                             Pertinent Vitals/Pain Pain Assessment Pain Assessment: Faces Faces Pain Scale: Hurts whole lot Pain Location: L knee Pain Descriptors / Indicators: Discomfort, Grimacing, Operative site guarding, Sore Pain Intervention(s): Limited activity within patient's tolerance, Monitored during session, Premedicated before session, Repositioned, Ice applied    Home Living Family/patient expects to be discharged to:: Private residence Living Arrangements: Alone Available Help at Discharge: Family;Available PRN/intermittently (son, son's girlfriend) Type of Home: House Home Access: Stairs to enter  Entrance Stairs-Number of Steps: 1 (1 step down into sunporch)   Home Layout: One level Home Equipment: Chartered certified accountant (2 wheels);BSC/3in1      Prior Function Prior Level of Function : Independent/Modified Independent                     Hand Dominance        Extremity/Trunk Assessment   Upper Extremity Assessment Upper Extremity Assessment: Overall WFL for tasks assessed    Lower Extremity Assessment Lower Extremity Assessment: LLE deficits/detail LLE Deficits / Details: s/p TKA. Grossly 2/5 strength    Cervical / Trunk Assessment Cervical / Trunk Assessment: Normal  Communication   Communication: No difficulties  Cognition Arousal/Alertness: Awake/alert Behavior During Therapy: WFL for tasks assessed/performed Overall Cognitive Status: Within Functional Limits for tasks assessed                                          General Comments      Exercises Total Joint Exercises Ankle Circles/Pumps: Both, 5 reps, Supine Quad Sets: Left, 10 reps, Supine Short Arc Quad: Left, 10 reps, Supine Heel Slides: Left, 10 reps, Supine Hip ABduction/ADduction: Left, 5 reps, Supine Long Arc Quad: Left, 5 reps, Seated Knee Flexion: Left, 5 reps, Seated Goniometric ROM: 8-85 degrees   Assessment/Plan    PT Assessment Patient needs continued PT services  PT Problem List Decreased strength;Decreased range of motion;Decreased activity tolerance;Decreased balance;Decreased mobility;Pain       PT Treatment Interventions DME instruction;Gait training;Stair training;Functional mobility training;Therapeutic activities;Therapeutic exercise;Balance training;Patient/family education    PT Goals (Current goals can be found in the Care Plan section)  Acute Rehab PT Goals Patient Stated Goal: go home PT Goal Formulation: With patient Time For Goal Achievement: 07/14/22 Potential to Achieve Goals: Good    Frequency 7X/week     Co-evaluation               AM-PAC PT "6 Clicks" Mobility  Outcome Measure Help needed turning from your back to your side while in a  flat bed without using bedrails?: None Help needed moving from lying on your back to sitting on the side of a flat bed without using bedrails?: None Help needed moving to and from a bed to a chair (including a wheelchair)?: A Little Help needed standing up from a chair using your arms (e.g., wheelchair or bedside chair)?: A Little Help needed to walk in hospital room?: A Little Help needed climbing 3-5 steps with a railing? : A Lot 6 Click Score: 19    End of Session Equipment Utilized During Treatment: Gait belt Activity Tolerance: Other (comment) (limited due to nausea/pre syncopal sx) Patient left: in chair;with call bell/phone within reach Nurse Communication: Mobility status PT Visit Diagnosis: Other abnormalities of gait and mobility (R26.89);Difficulty in walking, not elsewhere classified (R26.2);Pain Pain - Right/Left: Left Pain - part of body: Knee    Time: 0827-0902 PT Time Calculation (min) (ACUTE ONLY): 35 min   Charges:   PT Evaluation $PT Eval Low Complexity: 1 Low PT Treatments $Therapeutic Exercise: 8-22 mins        Wyona Almas, PT, DPT Acute Rehabilitation Services Office [phone]   Deno Etienne 06/30/2022, 9:41 AM

## 2022-06-30 NOTE — Progress Notes (Signed)
Indwelling urinary catheter discontinued per order. Pt tolerated well.

## 2022-06-30 NOTE — Evaluation (Signed)
Occupational Therapy Evaluation Patient Details Name: Kristy Henry MRN: [id number] DOB: 1950/12/15 Today's Date: 06/30/2022   History of Present Illness Pt is a 71 year old female s/p L TKA 06/29/22; PMH significant for former smoker, HTN, HLD, anxiety, depression, syncope (08/2018, unremarkable cardiac evaluation per Dr. Einar Gip   Clinical Impression   Chart reviewed, pt greeted in room agreeable to OT evaluation. PTA was indep in ADL/IADL is retired, lives alone. Pt reports son and sons girlfriend can assist as needed following discharge. Pt presents with deficits in activity tolerance affecting safe and optimal ADL completion. She performs all ADL with supervision including grooming standing at sink level, LB dressing (socks), toileting, requiring intermittent vcs for rest breaks and technique with RW as needed. Pt is able to amb approx 25' in room with RW with intermittent vcs for technique. Pt is left in bedside chair, all needs met. OT will follow acutely, anticipate no OT needs following discharge.      Recommendations for follow up therapy are one component of a multi-disciplinary discharge planning process, led by the attending physician.  Recommendations may be updated based on patient status, additional functional criteria and insurance authorization.   Follow Up Recommendations  No OT follow up    Assistance Recommended at Discharge Intermittent Supervision/Assistance  Patient can return home with the following A little help with walking and/or transfers;A little help with bathing/dressing/bathroom;Assistance with cooking/housework    Functional Status Assessment  Patient has had a recent decline in their functional status and demonstrates the ability to make significant improvements in function in a reasonable and predictable amount of time.  Equipment Recommendations  BSC/3in1;Tub/shower seat    Recommendations for Other Services       Precautions / Restrictions  Precautions Precautions: Fall;Knee Restrictions Weight Bearing Restrictions: No      Mobility Bed Mobility               General bed mobility comments: NT pt in recliner pre/post session    Transfers Overall transfer level: Needs assistance Equipment used: Rolling walker (2 wheels) Transfers: Sit to/from Stand Sit to Stand: Supervision           General transfer comment: from chair, bsc      Balance Overall balance assessment: Needs assistance Sitting-balance support: Feet supported Sitting balance-Leahy Scale: Good     Standing balance support: Bilateral upper extremity supported, During functional activity Standing balance-Leahy Scale: Fair                             ADL either performed or assessed with clinical judgement   ADL Overall ADL's : Needs assistance/impaired Eating/Feeding: Set up;Sitting   Grooming: Wash/dry hands;Wash/dry face;Standing;Supervision/safety Grooming Details (indicate cue type and reason): sink level, intermittent vcs for rest break as needed     Lower Body Bathing: Supervison/ safety;Sit to/from stand   Upper Body Dressing : Supervision/safety;Standing Upper Body Dressing Details (indicate cue type and reason): gown Lower Body Dressing: Supervision/safety Lower Body Dressing Details (indicate cue type and reason): sitting, socks Toilet Transfer: Supervision/safety;Rolling walker (2 wheels);Ambulation;BSC/3in1 Armed forces technical officer Details (indicate cue type and reason): one vc for technique Toileting- Clothing Manipulation and Hygiene: Supervision/safety;Sit to/from stand Toileting - Clothing Manipulation Details (indicate cue type and reason): peri care     Functional mobility during ADLs: Supervision/safety;Rolling walker (2 wheels) (approx 25' in room with RW)       Vision Baseline Vision/History: 1 Wears glasses Patient Visual Report: No  change from baseline       Perception     Praxis      Pertinent  Vitals/Pain Pain Assessment Pain Assessment: 0-10 Pain Score: 8  Pain Location: L knee Pain Descriptors / Indicators: Aching, Discomfort, Grimacing Pain Intervention(s): Limited activity within patient's tolerance, Monitored during session, Repositioned, Ice applied     Hand Dominance     Extremity/Trunk Assessment Upper Extremity Assessment Upper Extremity Assessment: Overall WFL for tasks assessed   Lower Extremity Assessment Lower Extremity Assessment: LLE deficits/detail LLE Deficits / Details: s/p L TKA   Cervical / Trunk Assessment Cervical / Trunk Assessment: Normal   Communication Communication Communication: No difficulties   Cognition Arousal/Alertness: Awake/alert Behavior During Therapy: WFL for tasks assessed/performed Overall Cognitive Status: Within Functional Limits for tasks assessed                                       General Comments  no c/o nausea/dizziness throughout evaluation    Exercises Other Exercises Other Exercises: edu re: role of OT, role of rehab, discharge recommendations, home safety, falls prevention   Shoulder Instructions      Home Living Family/patient expects to be discharged to:: Private residence Living Arrangements: Alone Available Help at Discharge: Family;Available PRN/intermittently (son who works, son girlfriend works from home and can assist per pt report) Type of Home: House Home Access: Stairs to enter Technical brewer of Steps: 1   [place]: One level     Bathroom Shower/Tub: Walk-in shower (with 2-3 inch lip)   Bathroom Toilet: Standard Bathroom Accessibility: No (RW does not go into bathroom)   Home Equipment: Conservation officer, nature (2 wheels);BSC/3in1          Prior Functioning/Environment Prior Level of Function : Independent/Modified Independent               ADLs Comments: MOD I-I in all ADL/IADL        OT Problem List: Decreased activity tolerance;Decreased knowledge of  use of DME or AE      OT Treatment/Interventions: Self-care/ADL training;Patient/family education;Therapeutic exercise;DME and/or AE instruction;Therapeutic activities    OT Goals(Current goals can be found in the care plan section) Acute Rehab OT Goals Patient Stated Goal: go home OT Goal Formulation: With patient Time For Goal Achievement: 07/14/22 Potential to Achieve Goals: Good ADL Goals Pt Will Perform Grooming: with modified independence;standing Pt Will Perform Lower Body Dressing: with modified independence;sit to/from stand Pt Will Transfer to Toilet: with modified independence;ambulating Pt Will Perform Toileting - Clothing Manipulation and hygiene: with modified independence;sit to/from stand  OT Frequency: Min 2X/week    Co-evaluation              AM-PAC OT "6 Clicks" Daily Activity     Outcome Measure Help from another person eating meals?: None Help from another person taking care of personal grooming?: None Help from another person toileting, which includes using toliet, bedpan, or urinal?: None Help from another person bathing (including washing, rinsing, drying)?: A Little Help from another person to put on and taking off regular upper body clothing?: None Help from another person to put on and taking off regular lower body clothing?: None 6 Click Score: 23   End of Session Equipment Utilized During Treatment: Rolling walker (2 wheels) Nurse Communication: Mobility status  Activity Tolerance: Patient tolerated treatment well Patient left: in chair;with call bell/phone within reach  OT Visit Diagnosis: Unsteadiness  on feet (R26.81)                Time: 2755-6239 OT Time Calculation (min): 21 min Charges:  OT General Charges $OT Visit: 1 Visit OT Evaluation $OT Eval Low Complexity: 1 Low  Shanon Payor, OTD OTR/L  06/30/22, 10:09 AM

## 2022-07-01 DIAGNOSIS — Z7982 Long term (current) use of aspirin: Secondary | ICD-10-CM | POA: Diagnosis not present

## 2022-07-01 DIAGNOSIS — G478 Other sleep disorders: Secondary | ICD-10-CM | POA: Diagnosis not present

## 2022-07-01 DIAGNOSIS — M1990 Unspecified osteoarthritis, unspecified site: Secondary | ICD-10-CM | POA: Diagnosis not present

## 2022-07-01 DIAGNOSIS — H539 Unspecified visual disturbance: Secondary | ICD-10-CM | POA: Diagnosis not present

## 2022-07-01 DIAGNOSIS — F32A Depression, unspecified: Secondary | ICD-10-CM | POA: Diagnosis not present

## 2022-07-01 DIAGNOSIS — Z471 Aftercare following joint replacement surgery: Secondary | ICD-10-CM | POA: Diagnosis not present

## 2022-07-01 DIAGNOSIS — Z9181 History of falling: Secondary | ICD-10-CM | POA: Diagnosis not present

## 2022-07-01 DIAGNOSIS — E7800 Pure hypercholesterolemia, unspecified: Secondary | ICD-10-CM | POA: Diagnosis not present

## 2022-07-01 DIAGNOSIS — Z8701 Personal history of pneumonia (recurrent): Secondary | ICD-10-CM | POA: Diagnosis not present

## 2022-07-01 DIAGNOSIS — I10 Essential (primary) hypertension: Secondary | ICD-10-CM | POA: Diagnosis not present

## 2022-07-01 DIAGNOSIS — F419 Anxiety disorder, unspecified: Secondary | ICD-10-CM | POA: Diagnosis not present

## 2022-07-01 DIAGNOSIS — Z96652 Presence of left artificial knee joint: Secondary | ICD-10-CM | POA: Diagnosis not present

## 2022-07-01 DIAGNOSIS — K5900 Constipation, unspecified: Secondary | ICD-10-CM | POA: Diagnosis not present

## 2022-07-01 DIAGNOSIS — Z90710 Acquired absence of both cervix and uterus: Secondary | ICD-10-CM | POA: Diagnosis not present

## 2022-07-02 ENCOUNTER — Telehealth: Payer: Self-pay | Admitting: *Deleted

## 2022-07-02 NOTE — Telephone Encounter (Signed)
Ortho bundle D/C call attempted. No answer and left VM.

## 2022-07-03 ENCOUNTER — Telehealth: Payer: Self-pay | Admitting: *Deleted

## 2022-07-03 ENCOUNTER — Other Ambulatory Visit: Payer: Self-pay | Admitting: *Deleted

## 2022-07-03 DIAGNOSIS — M1712 Unilateral primary osteoarthritis, left knee: Secondary | ICD-10-CM

## 2022-07-03 DIAGNOSIS — Z96652 Presence of left artificial knee joint: Secondary | ICD-10-CM

## 2022-07-03 NOTE — Telephone Encounter (Signed)
Ortho bundle D/C call completed. 

## 2022-07-06 ENCOUNTER — Telehealth: Payer: Self-pay | Admitting: *Deleted

## 2022-07-06 MED ORDER — KETOROLAC TROMETHAMINE 10 MG PO TABS: 10.0000 mg | ORAL_TABLET | Freq: Two times a day (BID) | ORAL | 0 refills | Status: DC | PRN

## 2022-07-06 NOTE — Telephone Encounter (Signed)
Call from patient this morning stating she really is having a hard time with side effects from the Hydromorphone. She is very emotional, crying and didn't want to take anymore. She is 1 week post op. She is asking what else she can have...like an anti-inflammatory or something like that. She states Tylenol doesn't work for her. She did confirm (per her chart) that she had some depression/suicidal ideation with Hydrocodone in the past as well, so I am concerned that maybe this is having those effects as well. Anything else for her pain with less side effects?

## 2022-07-06 NOTE — Telephone Encounter (Signed)
Toradol sent in.

## 2022-07-06 NOTE — Telephone Encounter (Signed)
thanks

## 2022-07-10 ENCOUNTER — Telehealth: Payer: Self-pay | Admitting: Orthopaedic Surgery

## 2022-07-10 NOTE — Therapy (Unsigned)
OUTPATIENT PHYSICAL THERAPY LOWER EXTREMITY EVALUATION   Patient Name: Kristy Henry MRN: [id number] DOB:06/03/1951, 71 y.o., female Today's Date: 07/10/2022    Past Medical History:  Diagnosis Date   Anxiety    Arthritis    Depression    High cholesterol    HLD (hyperlipidemia)    Hypertension    Pneumonia    Primary sleep disturbance    Syncope and collapse 2019   Vision abnormalities    Past Surgical History:  Procedure Laterality Date   ABDOMINAL HYSTERECTOMY     BONE EXCISION     from bilateral hands   BREAST BIOPSY Right 09/25/2016   eye lid surgery     RECTOCELE REPAIR     TOTAL KNEE ARTHROPLASTY Left 06/29/2022   Procedure: LEFT TOTAL KNEE ARTHROPLASTY;  Surgeon: Leandrew Koyanagi, MD;  Location: [place];  Service: Orthopedics;  Laterality: Left;   Patient Active Problem List   Diagnosis Date Noted   Status post total left knee replacement 06/29/2022   Primary osteoarthritis of left knee 06/03/2022   Non-restorative sleep 12/12/2018   Nocturia more than twice per night 12/12/2018   Snorings 12/12/2018   Menopausal hot flushes 12/12/2018   Excessive daytime sleepiness 12/12/2018    PCP: Kathyrn Lass, MD  REFERRING PROVIDER: Leandrew Koyanagi, MD  REFERRING DIAG: Status post total left knee replacement [Z96.652], Primary osteoarthritis of left knee [M17.12]  THERAPY DIAG:  No diagnosis found.  Rationale for Evaluation and Treatment Rehabilitation  ONSET DATE: ***  SUBJECTIVE:   SUBJECTIVE STATEMENT: ***  PERTINENT HISTORY: Anxiety, depression, Arthritis, HTN   PAIN:  Are you having pain? Yes: NPRS scale: ***/10 Pain location: *** Pain description: *** Aggravating factors: *** Relieving factors: ***  PRECAUTIONS: None  WEIGHT BEARING RESTRICTIONS {Yes ***/No:24003}  FALLS:  Has patient fallen in last 6 months? {fallsyesno:27318}  LIVING ENVIRONMENT: Lives with: {OPRC lives with:25569::"lives with their family"} Lives in: {Lives  in:25570} Stairs: {opstairs:27293} Has following equipment at home: {Assistive devices:23999}  OCCUPATION: ***  PLOF: Independent  PATIENT GOALS ***   OBJECTIVE:   DIAGNOSTIC FINDINGS: Left knee arthroplasty without immediate postoperative complication.  PATIENT SURVEYS:  FOTO ***  COGNITION:  Overall cognitive status: Within functional limits for tasks assessed     SENSATION: WFL  EDEMA:  {edema:24020}   POSTURE: {posture:25561}  PALPATION: ***  LOWER EXTREMITY ROM:  {AROM/PROM:27142} ROM Right eval Left eval  Hip flexion    Hip extension    Hip abduction    Hip adduction    Hip internal rotation    Hip external rotation    Knee flexion    Knee extension    Ankle dorsiflexion    Ankle plantarflexion    Ankle inversion    Ankle eversion     (Blank rows = not tested)  LOWER EXTREMITY MMT:  MMT Right eval Left eval  Knee flexion    Knee extension     (Blank rows = not tested)   GAIT: Distance walked: *** Assistive device utilized: {Assistive devices:23999} Level of assistance: {Levels of assistance:24026} Comments: ***    TODAY'S TREATMENT: Creating, reviewing, and completing below HEP    PATIENT EDUCATION:  Education details: Educated pt on anatomy and physiology of current symptoms, FOTO, diagnosis, prognosis, HEP,  and POC. Person educated: Patient and Parent Education method: Customer service manager Education comprehension: verbalized understanding and returned demonstration   HOME EXERCISE PROGRAM: ***   ASSESSMENT:  CLINICAL IMPRESSION: Patient referred to PT for s/p L TKA. Patient  will benefit from skilled PT to address below impairments, limitations and improve overall function.  OBJECTIVE IMPAIRMENTS: decreased activity tolerance, difficulty walking, decreased balance, decreased endurance, decreased mobility, decreased ROM, decreased strength, impaired flexibility, impaired UE/LE use, postural dysfunction, and  pain.  ACTIVITY LIMITATIONS: bending, lifting, carry, locomotion, cleaning, community activity, driving, and or occupation  PERSONAL FACTORS:  Anxiety, depression, Arthritis, HTN  are also affecting patient's functional outcome.  REHAB POTENTIAL: Good  CLINICAL DECISION MAKING: Stable/uncomplicated  EVALUATION COMPLEXITY: Low    GOALS: Short term PT Goals Target date: {follow up:25551} Pt will be I and compliant with HEP. Baseline:  Goal status: New Pt will decrease pain by 25% overall Baseline: Goal status: New  Long term PT goals Target date: {follow up:25551} Pt will improve ROM to [facility] to improve functional mobility Baseline: Goal status: New Pt will improve  hip/knee strength to at least 5-/5 MMT to improve functional strength Baseline: Goal status: New Pt will improve FOTO to at least % functional to show improved function Baseline: Goal status: New Pt will reduce pain by overall 50% overall with usual activity Baseline: Goal status: New Pt will reduce pain to overall less than 2-3/10 with usual activity and work activity. Baseline: Goal status: New Pt will be able to ambulate community distances at least 1000 ft WNL gait pattern without complaints Baseline: Goal status: New  PLAN: PT FREQUENCY: 1-3 times per week   PT DURATION: 6-8 weeks  PLANNED INTERVENTIONS (unless contraindicated): aquatic PT, Canalith repositioning, cryotherapy, Electrical stimulation, Iontophoresis with 4 mg/ml dexamethasome, Moist heat, traction, Ultrasound, gait training, Therapeutic exercise, balance training, neuromuscular re-education, patient/family education, prosthetic training, manual techniques, passive ROM, dry needling, taping, vasopnuematic device, vestibular, spinal manipulations, joint manipulations  PLAN FOR NEXT SESSION: Assess HEP/update PRN, continue to progress **** mobility, strengthen **** muscles. Decrease patients pain and help minimize ****   Lynden Ang,  PT 07/10/2022, 12:24 PM

## 2022-07-10 NOTE — Telephone Encounter (Signed)
Patient called. She would like a refill on her pain medication. Her call back number is [phone]

## 2022-07-12 ENCOUNTER — Other Ambulatory Visit: Payer: Self-pay | Admitting: Physician Assistant

## 2022-07-12 MED ORDER — HYDROMORPHONE HCL 2 MG PO TABS: 2.0000 mg | ORAL_TABLET | Freq: Four times a day (QID) | ORAL | 0 refills | Status: DC | PRN

## 2022-07-12 NOTE — Telephone Encounter (Signed)
Sent in

## 2022-07-13 DIAGNOSIS — Z96652 Presence of left artificial knee joint: Secondary | ICD-10-CM | POA: Diagnosis not present

## 2022-07-13 DIAGNOSIS — Z90710 Acquired absence of both cervix and uterus: Secondary | ICD-10-CM | POA: Diagnosis not present

## 2022-07-13 DIAGNOSIS — G478 Other sleep disorders: Secondary | ICD-10-CM | POA: Diagnosis not present

## 2022-07-13 DIAGNOSIS — F419 Anxiety disorder, unspecified: Secondary | ICD-10-CM | POA: Diagnosis not present

## 2022-07-13 DIAGNOSIS — K5900 Constipation, unspecified: Secondary | ICD-10-CM | POA: Diagnosis not present

## 2022-07-13 DIAGNOSIS — Z471 Aftercare following joint replacement surgery: Secondary | ICD-10-CM | POA: Diagnosis not present

## 2022-07-13 DIAGNOSIS — M1990 Unspecified osteoarthritis, unspecified site: Secondary | ICD-10-CM | POA: Diagnosis not present

## 2022-07-13 DIAGNOSIS — E7800 Pure hypercholesterolemia, unspecified: Secondary | ICD-10-CM | POA: Diagnosis not present

## 2022-07-13 DIAGNOSIS — H539 Unspecified visual disturbance: Secondary | ICD-10-CM | POA: Diagnosis not present

## 2022-07-13 DIAGNOSIS — F32A Depression, unspecified: Secondary | ICD-10-CM | POA: Diagnosis not present

## 2022-07-13 DIAGNOSIS — Z8701 Personal history of pneumonia (recurrent): Secondary | ICD-10-CM | POA: Diagnosis not present

## 2022-07-13 DIAGNOSIS — I10 Essential (primary) hypertension: Secondary | ICD-10-CM | POA: Diagnosis not present

## 2022-07-13 DIAGNOSIS — Z9181 History of falling: Secondary | ICD-10-CM | POA: Diagnosis not present

## 2022-07-13 DIAGNOSIS — Z7982 Long term (current) use of aspirin: Secondary | ICD-10-CM | POA: Diagnosis not present

## 2022-07-14 ENCOUNTER — Ambulatory Visit (INDEPENDENT_AMBULATORY_CARE_PROVIDER_SITE_OTHER): Payer: PPO | Admitting: Physician Assistant

## 2022-07-14 ENCOUNTER — Other Ambulatory Visit: Payer: Self-pay

## 2022-07-14 ENCOUNTER — Encounter: Payer: Self-pay | Admitting: Orthopaedic Surgery

## 2022-07-14 ENCOUNTER — Telehealth: Payer: Self-pay | Admitting: *Deleted

## 2022-07-14 ENCOUNTER — Ambulatory Visit (INDEPENDENT_AMBULATORY_CARE_PROVIDER_SITE_OTHER): Payer: PPO | Admitting: Physical Therapy

## 2022-07-14 DIAGNOSIS — M5432 Sciatica, left side: Secondary | ICD-10-CM

## 2022-07-14 DIAGNOSIS — M25562 Pain in left knee: Secondary | ICD-10-CM

## 2022-07-14 DIAGNOSIS — M6281 Muscle weakness (generalized): Secondary | ICD-10-CM

## 2022-07-14 DIAGNOSIS — R600 Localized edema: Secondary | ICD-10-CM

## 2022-07-14 DIAGNOSIS — Z96652 Presence of left artificial knee joint: Secondary | ICD-10-CM

## 2022-07-14 DIAGNOSIS — M25662 Stiffness of left knee, not elsewhere classified: Secondary | ICD-10-CM

## 2022-07-14 MED ORDER — PREDNISONE 10 MG (21) PO TBPK: ORAL_TABLET | ORAL | 0 refills | Status: DC

## 2022-07-14 MED ORDER — METHOCARBAMOL 750 MG PO TABS: 750.0000 mg | ORAL_TABLET | Freq: Three times a day (TID) | ORAL | 2 refills | Status: DC | PRN

## 2022-07-14 NOTE — Progress Notes (Signed)
Post-Op Visit Note   Patient: Kristy Henry           Date of Birth: 07-Nov-1950           MRN: [id number] Visit Date: 07/14/2022 PCP: Kathyrn Lass, MD   Assessment & Plan:  Chief Complaint:  Chief Complaint  Patient presents with   Left Knee - Follow-up    Left total knee arthroplasty 06/29/2022   Visit Diagnoses:  1. Hx of total knee replacement, left     Plan: Patient is a pleasant 71 year old female who comes in today 2 weeks status post left total knee replacement 06/29/2022.  She has been doing okay but has been in a fair amount of pain especially over the weekend as she ran out of medicine.  She is also having a flareup of left-sided sciatica.  She has been compliant taking her baby aspirin twice daily for DVT prophylaxis.  She has also been working with home health physical therapy and is scheduled to start outpatient physical therapy today.  Examination of the left knee reveals a fully healed surgical scar.  She does have mild peri-incisional erythema to the distal half of the incision.  No cellulitis or drainage.  Calf is soft and nontender.  She is neurovascular tact distally.  Today, sutures removed and Steri-Strips were applied.  She has been encouraged to wear compression socks.  I have sent in a Sterapred taper to help with the sciatica and recommended an over-the-counter PPI to help coat her stomach as she is also on a baby aspirin twice daily for DVT prophylaxis.  She understands and agrees of the importance of this.  She has Dilaudid at the pharmacy for which she will also pick up and I have refilled her Robaxin.  Dental prophylaxis reinforced.  Follow-up in 4 weeks time for repeat evaluation and 2 view x-rays of the left knee.  Call with concerns or questions in the meantime.   Follow-Up Instructions: Return in about 4 weeks (around 08/11/2022).   Orders:  No orders of the defined types were placed in this encounter.  Meds ordered this encounter  Medications    predniSONE (STERAPRED UNI-PAK 21 TAB) 10 MG (21) TBPK tablet    Sig: Take as directed.  Please take with food    Dispense:  21 tablet    Refill:  0   methocarbamol (ROBAXIN-750) 750 MG tablet    Sig: Take 1 tablet (750 mg total) by mouth every 8 (eight) hours as needed for muscle spasms.    Dispense:  20 tablet    Refill:  2    Imaging: No new imaging PMFS History: Patient Active Problem List   Diagnosis Date Noted   Status post total left knee replacement 06/29/2022   Primary osteoarthritis of left knee 06/03/2022   Non-restorative sleep 12/12/2018   Nocturia more than twice per night 12/12/2018   Snorings 12/12/2018   Menopausal hot flushes 12/12/2018   Excessive daytime sleepiness 12/12/2018   Past Medical History:  Diagnosis Date   Anxiety    Arthritis    Depression    High cholesterol    HLD (hyperlipidemia)    Hypertension    Pneumonia    Primary sleep disturbance    Syncope and collapse 2019   Vision abnormalities     Family History  Problem Relation Age of Onset   Heart disease Mother    Cancer Father    Colon cancer Father    Breast cancer Sister 10  High blood pressure Sister    Diabetes type II Brother    Diabetes type II Brother     Past Surgical History:  Procedure Laterality Date   ABDOMINAL HYSTERECTOMY     BONE EXCISION     from bilateral hands   BREAST BIOPSY Right 09/25/2016   eye lid surgery     RECTOCELE REPAIR     TOTAL KNEE ARTHROPLASTY Left 06/29/2022   Procedure: LEFT TOTAL KNEE ARTHROPLASTY;  Surgeon: Leandrew Koyanagi, MD;  Location: [place];  Service: Orthopedics;  Laterality: Left;   Social History   Occupational History   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

## 2022-07-14 NOTE — Telephone Encounter (Signed)
Ortho bundle 14 day in office meeting completed. °

## 2022-07-21 ENCOUNTER — Encounter: Payer: Self-pay | Admitting: Physical Therapy

## 2022-07-21 ENCOUNTER — Ambulatory Visit (INDEPENDENT_AMBULATORY_CARE_PROVIDER_SITE_OTHER): Payer: PPO | Admitting: Physical Therapy

## 2022-07-21 DIAGNOSIS — M25562 Pain in left knee: Secondary | ICD-10-CM | POA: Diagnosis not present

## 2022-07-21 DIAGNOSIS — M6281 Muscle weakness (generalized): Secondary | ICD-10-CM | POA: Diagnosis not present

## 2022-07-21 DIAGNOSIS — R600 Localized edema: Secondary | ICD-10-CM | POA: Diagnosis not present

## 2022-07-21 DIAGNOSIS — M25662 Stiffness of left knee, not elsewhere classified: Secondary | ICD-10-CM | POA: Diagnosis not present

## 2022-07-21 DIAGNOSIS — M5432 Sciatica, left side: Secondary | ICD-10-CM

## 2022-07-21 NOTE — Therapy (Signed)
OUTPATIENT PHYSICAL THERAPY TREATMENT NOTE   Patient Name: Kristy Henry MRN: [id number] DOB:[birth year], 71 y.o., female Today's Date: 07/21/2022  PCP: Kathyrn Lass, MD REFERRING PROVIDER: Leandrew Koyanagi, MD  END OF SESSION:   PT End of Session - 07/21/22 1345     Visit Number 2    Number of Visits 16    Authorization Type [employer]    PT Start Time 3762    PT Stop Time 1350    PT Time Calculation (min) 45 min    Activity Tolerance Patient tolerated treatment well    Behavior During Therapy WFL for tasks assessed/performed             Past Medical History:  Diagnosis Date   Anxiety    Arthritis    Depression    High cholesterol    HLD (hyperlipidemia)    Hypertension    Pneumonia    Primary sleep disturbance    Syncope and collapse 2019   Vision abnormalities    Past Surgical History:  Procedure Laterality Date   ABDOMINAL HYSTERECTOMY     BONE EXCISION     from bilateral hands   BREAST BIOPSY Right 09/25/2016   eye lid surgery     RECTOCELE REPAIR     TOTAL KNEE ARTHROPLASTY Left 06/29/2022   Procedure: LEFT TOTAL KNEE ARTHROPLASTY;  Surgeon: Leandrew Koyanagi, MD;  Location: [place];  Service: Orthopedics;  Laterality: Left;   Patient Active Problem List   Diagnosis Date Noted   Status post total left knee replacement 06/29/2022   Primary osteoarthritis of left knee 06/03/2022   Non-restorative sleep 12/12/2018   Nocturia more than twice per night 12/12/2018   Snorings 12/12/2018   Menopausal hot flushes 12/12/2018   Excessive daytime sleepiness 12/12/2018    REFERRING DIAG: Status post total left knee replacement [Z96.652], Primary osteoarthritis of left knee [M17.12]  THERAPY DIAG:  Stiffness of left knee, not elsewhere classified  Acute pain of left knee  Localized edema  Muscle weakness (generalized)  Sciatica, left side  Rationale for Evaluation and Treatment Rehabilitation  PERTINENT HISTORY: Anxiety, depression,  Arthritis, HTN   PRECAUTIONS: none  SUBJECTIVE: Pt stating that she tripped over her cat and her sciatica has flared up which is making her knee worse.   PAIN:  Are you having pain? Yes: NPRS scale: 7/10 Pain location: left knee Pain description: achy, burning Aggravating factors: bending Relieving factors: ice, heat, pain meds   OBJECTIVE: (objective measures completed at initial evaluation unless otherwise dated)    DIAGNOSTIC FINDINGS: Left knee arthroplasty without immediate postoperative complication.   PATIENT SURVEYS:  FOTO 52.48%, 63% in 16 visits   COGNITION:           Overall cognitive status: Within functional limits for tasks assessed                          SENSATION: WFL   EDEMA:  07/21/22: Circumferential: Left 42 centimeters                        Right 35 centimeters  07/14/22: Circumferential: R:49"  L: 35"       POSTURE: rounded shoulders and forward head   PALPATION: Tenderness surrounding surgical site.    LOWER EXTREMITY ROM:   Passive ROM Right eval Left eval  Knee flexion 128 80/90  Knee extension 0 -10 (lacking)    (Blank rows = not  tested)   LOWER EXTREMITY MMT:   MMT Right 07/21/22 Left 07/21/22  Knee flexion  5/5 3/5   Knee extension  5/5 3/5    (Blank rows = not tested)     GAIT: Distance walked: 57f  Assistive device utilized: Single point cane Level of assistance: Complete Independence Comments: Pt walks with decreased stance time on L LE.      TODAY'S TREATMENT: 07/21/22:  TherEx:  Seated heel slides x 20 Sit to stand x 10 UE support Seated SLR x 15 Seated LAQ x 15 Supine QS x 20 holding 5 sec Moadlites: Vasopneumatic, low compression, 36 degrees, x 10 minutes Moist heat placed on pt's Low back x 10 minutes while on vaso   07/14/22:  Eval was only performed today        PATIENT EDUCATION:  Education details: Educated pt on anatomy and physiology of current symptoms, FOTO, diagnosis, prognosis, HEP,   and POC. Person educated: Patient and Parent Education method: ECustomer service managerEducation comprehension: verbalized understanding and returned demonstration     HOME EXERCISE PROGRAM: Access Code: [email or web address]: [email or web address] Date: 07/21/2022 Prepared by: JKearney Hard Exercises - Supine Quad Set  - 3 x daily - 7 x weekly - 2 sets - 10 reps - 5 seconds hold - Supine Heel Slide with Strap  - 3 x daily - 7 x weekly - 10 reps - 5 seconds hold - Seated Long Arc Quad  - 3 x daily - 7 x weekly - 2 sets - 10 reps - 5 seconds hold - Seated Knee Flexion AAROM  - 3 x daily - 7 x weekly - 10 reps - 5 seconds hold - Sit to Stand with Counter Support  - 3 x daily - 7 x weekly - 3 sets - 10 reps   07/14/22:  None today due to increased pain. Pt requested to leave and denied ice.      ASSESSMENT:   CLINICAL IMPRESSION:  Pt arriving today reporting 7-8/10 in her left knee and in her left sided low back due to flare up of her sciatica. Today pt's therapeutic exercises were limited by pain and pt requiring increased time. Pt with some redness noted along lateral incision site. We took a photo of pt's knee and pt was instructed to monitor the redness to see if it improved or worsened. Pt was instructed to contact her MD. Pt did report a decrease in her pain symptoms at end of treatment following Vaso-pneumatic for her left knee and moist heat for her low back.    OBJECTIVE IMPAIRMENTS: decreased activity tolerance, difficulty walking, decreased balance, decreased endurance, decreased mobility, decreased ROM, decreased strength, impaired flexibility, impaired LE use, postural dysfunction, and pain.   ACTIVITY LIMITATIONS: bending, lifting, carry, locomotion, cleaning, community activity, driving, and or occupation   PERSONAL FACTORS:  Anxiety, depression, Arthritis, HTN  are also affecting patient's functional outcome.   REHAB POTENTIAL: Good   CLINICAL DECISION  MAKING: Stable/uncomplicated   EVALUATION COMPLEXITY: Low       GOALS: Short term PT Goals Target date: 08/11/2022 SHORT TERM GOALS: Target date: 08/13/2022  Will be compliant with appropriate progressive HEP  Baseline: Goal status: On-going 07/21/22     Will be able to complete bed mobility and functional transfers from standard height chair with no more than general S  Baseline:  Goal status: INITIAL    R knee extension AROM to be no more than 5 degrees and flexion AROM to be no  less than 115 degrees  Baseline:  Goal status: INITIAL    Pain in R knee to be no more than 4/10 at worst  Baseline:  Goal status: INITIAL      Long term PT goals Target date: 09/08/2022 Pt will improve ROM to [facility] to improve functional mobility Baseline: Goal status: New       2. MMT to improve by at least 1 grade in all weak groups       Baseline:       Goal status: INITIAL Pt will improve FOTO to at least 63% functional to show improved function Baseline: Goal status: New Pt will reduce pain by overall 50% overall with usual activity Baseline: Goal status: New Pt will reduce pain to overall less than 2-3/10 with usual activity and work activity. Baseline: Goal status: New Pt will be able to ambulate community distances at least 1000 ft WNL gait pattern without complaints Baseline: Goal status: New   PLAN: PT FREQUENCY: 1-3 times per week    PT DURATION: 6-8 weeks   PLANNED INTERVENTIONS (unless contraindicated): aquatic PT, Canalith repositioning, cryotherapy, Electrical stimulation, Iontophoresis with 4 mg/ml dexamethasome, Moist heat, traction, Ultrasound, gait training, Therapeutic exercise, balance training, neuromuscular re-education, patient/family education, prosthetic training, manual techniques, passive ROM, dry needling, taping, vasopnuematic device, vestibular, spinal manipulations, joint manipulations   PLAN FOR NEXT SESSION: progress functional mobility, strengthen proximal  hip and bilat LE muscles. Decrease patients pain and help minimize functional deficits. Improve gait and balance. Reduce swelling. Assess functional test next session.    Oretha Caprice, PT, MPT 07/21/2022, 1:46 PM

## 2022-07-23 ENCOUNTER — Encounter: Payer: Self-pay | Admitting: Orthopaedic Surgery

## 2022-07-23 ENCOUNTER — Ambulatory Visit (INDEPENDENT_AMBULATORY_CARE_PROVIDER_SITE_OTHER): Payer: PPO | Admitting: Orthopaedic Surgery

## 2022-07-23 DIAGNOSIS — Z96652 Presence of left artificial knee joint: Secondary | ICD-10-CM

## 2022-07-23 MED ORDER — HYDROMORPHONE HCL 2 MG PO TABS: 2.0000 mg | ORAL_TABLET | Freq: Two times a day (BID) | ORAL | 0 refills | Status: DC | PRN

## 2022-07-23 MED ORDER — GABAPENTIN 300 MG PO CAPS: 300.0000 mg | ORAL_CAPSULE | Freq: Every day | ORAL | 2 refills | Status: DC

## 2022-07-23 MED ORDER — PREDNISONE 10 MG (21) PO TBPK: ORAL_TABLET | ORAL | 3 refills | Status: DC

## 2022-07-23 NOTE — Progress Notes (Signed)
Post-Op Visit Note   Patient: Kristy Henry           Date of Birth: Feb 14, 1951           MRN: [id number] Visit Date: 07/23/2022 PCP: Kathyrn Lass, MD   Assessment & Plan:  Chief Complaint:  Chief Complaint  Patient presents with   Left Knee - Follow-up    Left total knee arthroplasty 06/29/2022   Visit Diagnoses:  1. Status post total left knee replacement     Plan: Beverlee Nims returns today for recheck of her left knee.  Her sciatica continues to be very distressing to her.  She is tearful about how the sciatica has affected her ability to recover from the knee replacement.  Affects her sleeping quite a bit.  She continues to do the CPM but she is having some difficulty with range of motion overall mainly because of the sciatica.  Takes Tylenol every 5 hours.  Using a walker currently.  Examination of the left knee shows healed surgical scar.  Expected postoperative swelling and slight redness due to inflammation.  No signs of infection.  No drainage.  Range of motion is 10 to 90 degrees with moderate pain.  In regards to the knee she is recovering and rehabbing slowly due to the sciatica.  We will extend her CPM so she can maximize the time that she has with it.  She will continue to do physical therapy.  I encouraged her to take an occasional Dilaudid tablet in order to do physical therapy and to sleep.  I have also refilled the prednisone Dosepak and prescribed gabapentin which will hopefully help with the sciatica.  We will see her back in 3 weeks with 2 view x-rays of the left knee.  Follow-Up Instructions: Return in about 3 weeks (around 08/13/2022).   Orders:  No orders of the defined types were placed in this encounter.  Meds ordered this encounter  Medications   predniSONE (STERAPRED UNI-PAK 21 TAB) 10 MG (21) TBPK tablet    Sig: Take as directed    Dispense:  21 tablet    Refill:  3   gabapentin (NEURONTIN) 300 MG capsule    Sig: Take 1 capsule (300 mg total) by  mouth at bedtime.    Dispense:  30 capsule    Refill:  2   HYDROmorphone (DILAUDID) 2 MG tablet    Sig: Take 1 tablet (2 mg total) by mouth 2 (two) times daily as needed for severe pain. To be taken after surgery    Dispense:  30 tablet    Refill:  0    Imaging: No results found.  PMFS History: Patient Active Problem List   Diagnosis Date Noted   Status post total left knee replacement 06/29/2022   Primary osteoarthritis of left knee 06/03/2022   Non-restorative sleep 12/12/2018   Nocturia more than twice per night 12/12/2018   Snorings 12/12/2018   Menopausal hot flushes 12/12/2018   Excessive daytime sleepiness 12/12/2018   Past Medical History:  Diagnosis Date   Anxiety    Arthritis    Depression    High cholesterol    HLD (hyperlipidemia)    Hypertension    Pneumonia    Primary sleep disturbance    Syncope and collapse 2019   Vision abnormalities     Family History  Problem Relation Age of Onset   Heart disease Mother    Cancer Father    Colon cancer Father    Breast cancer  Sister 35   High blood pressure Sister    Diabetes type II Brother    Diabetes type II Brother     Past Surgical History:  Procedure Laterality Date   ABDOMINAL HYSTERECTOMY     BONE EXCISION     from bilateral hands   BREAST BIOPSY Right 09/25/2016   eye lid surgery     RECTOCELE REPAIR     TOTAL KNEE ARTHROPLASTY Left 06/29/2022   Procedure: LEFT TOTAL KNEE ARTHROPLASTY;  Surgeon: Leandrew Koyanagi, MD;  Location: [place];  Service: Orthopedics;  Laterality: Left;   Social History   Occupational History   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

## 2022-07-24 ENCOUNTER — Ambulatory Visit (INDEPENDENT_AMBULATORY_CARE_PROVIDER_SITE_OTHER): Payer: PPO | Admitting: Rehabilitative and Restorative Service Providers"

## 2022-07-24 ENCOUNTER — Encounter: Payer: Self-pay | Admitting: Rehabilitative and Restorative Service Providers"

## 2022-07-24 DIAGNOSIS — M25662 Stiffness of left knee, not elsewhere classified: Secondary | ICD-10-CM | POA: Diagnosis not present

## 2022-07-24 DIAGNOSIS — R600 Localized edema: Secondary | ICD-10-CM | POA: Diagnosis not present

## 2022-07-24 DIAGNOSIS — M25562 Pain in left knee: Secondary | ICD-10-CM | POA: Diagnosis not present

## 2022-07-24 DIAGNOSIS — M5432 Sciatica, left side: Secondary | ICD-10-CM | POA: Diagnosis not present

## 2022-07-24 DIAGNOSIS — M6281 Muscle weakness (generalized): Secondary | ICD-10-CM | POA: Diagnosis not present

## 2022-07-24 NOTE — Therapy (Signed)
OUTPATIENT PHYSICAL THERAPY TREATMENT NOTE   Patient Name: Kristy Henry MRN: [id number] DOB:12-31-1950, 71 y.o., female Today's Date: 07/24/2022  PCP: Kathyrn Lass, MD REFERRING PROVIDER: Leandrew Koyanagi, MD  END OF SESSION:   PT End of Session - 07/24/22 1300     Visit Number 3    Number of Visits 16    Authorization Type [employer]    PT Start Time 3154    PT Stop Time 1348    PT Time Calculation (min) 50 min    Activity Tolerance Patient tolerated treatment well    Behavior During Therapy WFL for tasks assessed/performed              Past Medical History:  Diagnosis Date   Anxiety    Arthritis    Depression    High cholesterol    HLD (hyperlipidemia)    Hypertension    Pneumonia    Primary sleep disturbance    Syncope and collapse 2019   Vision abnormalities    Past Surgical History:  Procedure Laterality Date   ABDOMINAL HYSTERECTOMY     BONE EXCISION     from bilateral hands   BREAST BIOPSY Right 09/25/2016   eye lid surgery     RECTOCELE REPAIR     TOTAL KNEE ARTHROPLASTY Left 06/29/2022   Procedure: LEFT TOTAL KNEE ARTHROPLASTY;  Surgeon: Leandrew Koyanagi, MD;  Location: [place];  Service: Orthopedics;  Laterality: Left;   Patient Active Problem List   Diagnosis Date Noted   Status post total left knee replacement 06/29/2022   Primary osteoarthritis of left knee 06/03/2022   Non-restorative sleep 12/12/2018   Nocturia more than twice per night 12/12/2018   Snorings 12/12/2018   Menopausal hot flushes 12/12/2018   Excessive daytime sleepiness 12/12/2018    REFERRING DIAG: Status post total left knee replacement [Z96.652], Primary osteoarthritis of left knee [M17.12]  THERAPY DIAG:  Stiffness of left knee, not elsewhere classified  Acute pain of left knee  Localized edema  Muscle weakness (generalized)  Sciatica, left side  Rationale for Evaluation and Treatment Rehabilitation  PERTINENT HISTORY: Anxiety, depression,  Arthritis, HTN   PRECAUTIONS: none  SUBJECTIVE:  Pt indicated having "not bad" today in knee.  Pt indicated some lessening of sciatic complaints with pain medicine.  SPC use off and on with FWW.    PAIN:  NPRS scale:  at worst in last 24 hours - 5/10 Pain location: left knee Pain description: achy, burning Aggravating factors: bending Relieving factors: ice, heat, pain meds   OBJECTIVE: (objective measures completed at initial evaluation unless otherwise dated)    DIAGNOSTIC FINDINGS:  07/14/2022 review: Left knee arthroplasty without immediate postoperative complication.   PATIENT SURVEYS:  07/14/2022 FOTO 52.48%, 63% in 16 visits   COGNITION: 07/14/2022           Overall cognitive status: Within functional limits for tasks assessed                          SENSATION: 07/14/2022 [facility]   EDEMA:  07/21/22: Circumferential: Left 42 centimeters                        Right 35 centimeters  07/14/22: Circumferential: R:49"  L: 35"   POSTURE:  07/14/2022 rounded shoulders and forward head   PALPATION: 07/14/2022 Tenderness surrounding surgical site.    LOWER EXTREMITY ROM:   Passive ROM Right 07/14/2022 Left 07/14/2022  Left 07/24/2022  Knee flexion 128 80/90 98   Knee extension 0 -10 (lacking)     (Blank rows = not tested)   LOWER EXTREMITY MMT:   MMT Right 07/21/22 Left 07/21/22  Knee flexion  5/5 3/5   Knee extension  5/5 3/5    (Blank rows = not tested)     GAIT: 07/14/2022 Distance walked: 21f  Assistive device utilized: Single point cane Level of assistance: Complete Independence Comments: Pt walks with decreased stance time on L LE.      TODAY'S TREATMENT: 07/24/2022:  TherEx:  Nustep Lvl 5 7 mins UE/LE Seated LAQ c end range pause in flexion/extension x 15 Seated Lt knee isometric low contraction level due to pain 5 sec alternating extension and flexion in mid range Supine quad set 5 sec hold x 10 bilateral Supine heel slide Lt AROM to tolerance 5  sec x 10    Vasopneumatic Medium compression, 34 degrees, x 10 minutes in elevation  07/21/2022:  TherEx:  Seated heel slides x 20 Sit to stand x 10 UE support Seated SLR x 15 Seated LAQ x 15 Supine QS x 20 holding 5 sec Moadlites: Vasopneumatic, low compression, 36 degrees, x 10 minutes Moist heat placed on pt's Low back x 10 minutes while on vaso      PATIENT EDUCATION:  07/21/2022 Education details: HEP update Person educated: Patient and Parent Education method: ECustomer service managerEducation comprehension: verbalized understanding and returned demonstration     HOME EXERCISE PROGRAM: Access Code: [email or web address]: [email or web address] Date: 07/21/2022 Prepared by: JKearney Hard Exercises - Supine Quad Set  - 3 x daily - 7 x weekly - 2 sets - 10 reps - 5 seconds hold - Supine Heel Slide with Strap  - 3 x daily - 7 x weekly - 10 reps - 5 seconds hold - Seated Long Arc Quad  - 3 x daily - 7 x weekly - 2 sets - 10 reps - 5 seconds hold - Seated Knee Flexion AAROM  - 3 x daily - 7 x weekly - 10 reps - 5 seconds hold - Sit to Stand with Counter Support  - 3 x daily - 7 x weekly - 3 sets - 10 reps    ASSESSMENT:   CLINICAL IMPRESSION:  Tolerance to activity was fair today.  Difficulty c muscle activation.  Due to difficulty, early interventions in NWB performed to promote  improved tolerance and strengthening.  Continued skilled PT services indicated.    OBJECTIVE IMPAIRMENTS: decreased activity tolerance, difficulty walking, decreased balance, decreased endurance, decreased mobility, decreased ROM, decreased strength, impaired flexibility, impaired LE use, postural dysfunction, and pain.   ACTIVITY LIMITATIONS: bending, lifting, carry, locomotion, cleaning, community activity, driving, and or occupation   PERSONAL FACTORS:  Anxiety, depression, Arthritis, HTN  are also affecting patient's functional outcome.   REHAB POTENTIAL: Good    CLINICAL DECISION MAKING: Stable/uncomplicated   EVALUATION COMPLEXITY: Low       GOALS: Short term PT Goals Target date: 08/11/2022 SHORT TERM GOALS: Target date: 08/13/2022  Will be compliant with appropriate progressive HEP  Baseline: Goal status: On-going 07/21/22     Will be able to complete bed mobility and functional transfers from standard height chair with no more than general S  Baseline:  Goal status: on going - assessed 07/24/2022    R knee extension AROM to be no more than 5 degrees and flexion AROM to be no less than 115 degrees  Baseline:  Goal status:  on going - assessed 07/24/2022    Pain in R knee to be no more than 4/10 at worst  Baseline:  Goal status: on going - assessed 07/24/2022     Long term PT goals Target date: 09/08/2022 Pt will improve ROM to [facility] to improve functional mobility Baseline: Goal status: New       2. MMT to improve by at least 1 grade in all weak groups       Baseline:       Goal status: INITIAL Pt will improve FOTO to at least 63% functional to show improved function Baseline: Goal status: New Pt will reduce pain by overall 50% overall with usual activity Baseline: Goal status: New Pt will reduce pain to overall less than 2-3/10 with usual activity and work activity. Baseline: Goal status: New Pt will be able to ambulate community distances at least 1000 ft WNL gait pattern without complaints Baseline: Goal status: New   PLAN: PT FREQUENCY: 1-3 times per week    PT DURATION: 6-8 weeks   PLANNED INTERVENTIONS (unless contraindicated): aquatic PT, Canalith repositioning, cryotherapy, Electrical stimulation, Iontophoresis with 4 mg/ml dexamethasome, Moist heat, traction, Ultrasound, gait training, Therapeutic exercise, balance training, neuromuscular re-education, patient/family education, prosthetic training, manual techniques, passive ROM, dry needling, taping, vasopnuematic device, vestibular, spinal manipulations, joint  manipulations   PLAN FOR NEXT SESSION: Continued manual for mobility gains, strengthening as tolerated. Early balance interventions.    Scot Jun, PT, DPT, OCS, ATC 07/24/22  1:40 PM

## 2022-07-27 ENCOUNTER — Encounter: Payer: PPO | Admitting: Physical Therapy

## 2022-07-28 ENCOUNTER — Encounter: Payer: Self-pay | Admitting: Rehabilitative and Restorative Service Providers"

## 2022-07-28 ENCOUNTER — Ambulatory Visit (INDEPENDENT_AMBULATORY_CARE_PROVIDER_SITE_OTHER): Payer: PPO | Admitting: Rehabilitative and Restorative Service Providers"

## 2022-07-28 DIAGNOSIS — M6281 Muscle weakness (generalized): Secondary | ICD-10-CM | POA: Diagnosis not present

## 2022-07-28 DIAGNOSIS — M5432 Sciatica, left side: Secondary | ICD-10-CM

## 2022-07-28 DIAGNOSIS — R600 Localized edema: Secondary | ICD-10-CM | POA: Diagnosis not present

## 2022-07-28 DIAGNOSIS — M25562 Pain in left knee: Secondary | ICD-10-CM | POA: Diagnosis not present

## 2022-07-28 DIAGNOSIS — M25662 Stiffness of left knee, not elsewhere classified: Secondary | ICD-10-CM | POA: Diagnosis not present

## 2022-07-28 NOTE — Therapy (Signed)
OUTPATIENT PHYSICAL THERAPY TREATMENT NOTE   Patient Name: Kristy Henry MRN: [id number] DOB:1951/05/06, 71 y.o., female Today's Date: 07/28/2022  PCP: Kathyrn Lass, MD REFERRING PROVIDER: Leandrew Koyanagi, MD  END OF SESSION:   PT End of Session - 07/28/22 1342     Visit Number 4    Number of Visits 16    Authorization Type [employer]    PT Start Time 7341    PT Stop Time 1350    PT Time Calculation (min) 51 min    Activity Tolerance Patient tolerated treatment well    Behavior During Therapy WFL for tasks assessed/performed               Past Medical History:  Diagnosis Date   Anxiety    Arthritis    Depression    High cholesterol    HLD (hyperlipidemia)    Hypertension    Pneumonia    Primary sleep disturbance    Syncope and collapse 2019   Vision abnormalities    Past Surgical History:  Procedure Laterality Date   ABDOMINAL HYSTERECTOMY     BONE EXCISION     from bilateral hands   BREAST BIOPSY Right 09/25/2016   eye lid surgery     RECTOCELE REPAIR     TOTAL KNEE ARTHROPLASTY Left 06/29/2022   Procedure: LEFT TOTAL KNEE ARTHROPLASTY;  Surgeon: Leandrew Koyanagi, MD;  Location: [place];  Service: Orthopedics;  Laterality: Left;   Patient Active Problem List   Diagnosis Date Noted   Status post total left knee replacement 06/29/2022   Primary osteoarthritis of left knee 06/03/2022   Non-restorative sleep 12/12/2018   Nocturia more than twice per night 12/12/2018   Snorings 12/12/2018   Menopausal hot flushes 12/12/2018   Excessive daytime sleepiness 12/12/2018    REFERRING DIAG: Status post total left knee replacement [Z96.652], Primary osteoarthritis of left knee [M17.12]  THERAPY DIAG:  Stiffness of left knee, not elsewhere classified  Acute pain of left knee  Localized edema  Muscle weakness (generalized)  Sciatica, left side  Rationale for Evaluation and Treatment Rehabilitation  PERTINENT HISTORY: Anxiety, depression,  Arthritis, HTN   PRECAUTIONS: none  SUBJECTIVE:  Pt indicated mild complaints in knee, stiffness noted.  Pt indicated back continued to feel better than previous.    PAIN:  NPRS scale:  at worst in last 24 hours - 5/10 Pain location: left knee Pain description: achy, burning Aggravating factors: bending Relieving factors: ice, heat, pain meds   OBJECTIVE: (objective measures completed at initial evaluation unless otherwise dated)    DIAGNOSTIC FINDINGS:  07/14/2022 review: Left knee arthroplasty without immediate postoperative complication.   PATIENT SURVEYS:  07/14/2022 FOTO 52.48%, 63% in 16 visits   COGNITION: 07/14/2022           Overall cognitive status: Within functional limits for tasks assessed                          SENSATION: 07/14/2022 [facility]   EDEMA:  07/21/22: Circumferential: Left 42 centimeters                        Right 35 centimeters  07/14/22: Circumferential: R:49"  L: 35"   POSTURE:  07/14/2022 rounded shoulders and forward head   PALPATION: 07/14/2022 Tenderness surrounding surgical site.    LOWER EXTREMITY ROM:   Passive ROM Right 07/14/2022 Left 07/14/2022 Left 07/24/2022  Knee flexion 128 80/90 98  Knee extension 0 -10 (lacking)     (Blank rows = not tested)   LOWER EXTREMITY MMT:   MMT Right 07/21/22 Left 07/21/22  Knee flexion  5/5 3/5   Knee extension  5/5 3/5    (Blank rows = not tested)     GAIT: 07/14/2022 Distance walked: 72f  Assistive device utilized: Single point cane Level of assistance: Complete Independence Comments: Pt walks with decreased stance time on L LE.      TODAY'S TREATMENT: 07/28/2022:  TherEx:  Nustep Lvl 5 8 mins UE/LE Leg press single leg , lying supine 12 lbs x 12 (mild knee pain) Seated Lt knee ROM c Rt leg overpressure 15 sec x 5 Seated LAQ c end range pause in flexion/extension 2 x 10 1.5 lbs Seated Lt knee isometric low contraction level due to pain 5 sec alternating extension and flexion  in mid range Seated quad set 5 sec hold x 10 bilateral (try SLR next visit in sitting, elevated mat standing)   Vasopneumatic Medium compression, 34 degrees, x 10 minutes in elevation  07/24/2022:  TherEx:  Nustep Lvl 5 7 mins UE/LE Seated LAQ c end range pause in flexion/extension x 15 Seated Lt knee isometric low contraction level due to pain 5 sec alternating extension and flexion in mid range Supine quad set 5 sec hold x 10 bilateral Supine heel slide Lt AROM to tolerance 5 sec x 10    Vasopneumatic Medium compression, 34 degrees, x 10 minutes in elevation  07/21/2022:  TherEx:  Seated heel slides x 20 Sit to stand x 10 UE support Seated SLR x 15 Seated LAQ x 15 Supine QS x 20 holding 5 sec Moadlites: Vasopneumatic, low compression, 36 degrees, x 10 minutes Moist heat placed on pt's Low back x 10 minutes while on vaso      PATIENT EDUCATION:  07/21/2022 Education details: HEP update Person educated: Patient and Parent Education method: ECustomer service managerEducation comprehension: verbalized understanding and returned demonstration     HOME EXERCISE PROGRAM: Access Code: [email or web address]: [email or web address] Date: 07/21/2022 Prepared by: JKearney Hard Exercises - Supine Quad Set  - 3 x daily - 7 x weekly - 2 sets - 10 reps - 5 seconds hold - Supine Heel Slide with Strap  - 3 x daily - 7 x weekly - 10 reps - 5 seconds hold - Seated Long Arc Quad  - 3 x daily - 7 x weekly - 2 sets - 10 reps - 5 seconds hold - Seated Knee Flexion AAROM  - 3 x daily - 7 x weekly - 10 reps - 5 seconds hold - Sit to Stand with Counter Support  - 3 x daily - 7 x weekly - 3 sets - 10 reps    ASSESSMENT:   CLINICAL IMPRESSION:  Difficulty in WB strengthening activity continued due to weakness and Lt knee pain complaints.  Mild improvement in quad activation noted in sitting activity.   Continued skilled PT services indicated at this time.    OBJECTIVE  IMPAIRMENTS: decreased activity tolerance, difficulty walking, decreased balance, decreased endurance, decreased mobility, decreased ROM, decreased strength, impaired flexibility, impaired LE use, postural dysfunction, and pain.   ACTIVITY LIMITATIONS: bending, lifting, carry, locomotion, cleaning, community activity, driving, and or occupation   PERSONAL FACTORS:  Anxiety, depression, Arthritis, HTN  are also affecting patient's functional outcome.   REHAB POTENTIAL: Good   CLINICAL DECISION MAKING: Stable/uncomplicated   EVALUATION COMPLEXITY: Low       GOALS:  Short term PT Goals Target date: 08/11/2022 SHORT TERM GOALS: Target date: 08/13/2022  Will be compliant with appropriate progressive HEP  Baseline: Goal status: On-going 07/21/22     Will be able to complete bed mobility and functional transfers from standard height chair with no more than general S  Baseline:  Goal status: on going - assessed 07/24/2022    R knee extension AROM to be no more than 5 degrees and flexion AROM to be no less than 115 degrees  Baseline:  Goal status: on going - assessed 07/24/2022    Pain in R knee to be no more than 4/10 at worst  Baseline:  Goal status: on going - assessed 07/24/2022     Long term PT goals Target date: 09/08/2022 Pt will improve ROM to [facility] to improve functional mobility Baseline: Goal status: New       2. MMT to improve by at least 1 grade in all weak groups       Baseline:       Goal status: INITIAL Pt will improve FOTO to at least 63% functional to show improved function Baseline: Goal status: New Pt will reduce pain by overall 50% overall with usual activity Baseline: Goal status: New Pt will reduce pain to overall less than 2-3/10 with usual activity and work activity. Baseline: Goal status: New Pt will be able to ambulate community distances at least 1000 ft WNL gait pattern without complaints Baseline: Goal status: New   PLAN: PT FREQUENCY: 1-3 times  per week    PT DURATION: 6-8 weeks   PLANNED INTERVENTIONS (unless contraindicated): aquatic PT, Canalith repositioning, cryotherapy, Electrical stimulation, Iontophoresis with 4 mg/ml dexamethasome, Moist heat, traction, Ultrasound, gait training, Therapeutic exercise, balance training, neuromuscular re-education, patient/family education, prosthetic training, manual techniques, passive ROM, dry needling, taping, vasopnuematic device, vestibular, spinal manipulations, joint manipulations   PLAN FOR NEXT SESSION: Sitting SLR trials, elevated no UE transfers for strengthening. Static balance.    Scot Jun, PT, DPT, OCS, ATC 07/28/22  1:43 PM

## 2022-07-30 ENCOUNTER — Telehealth: Payer: Self-pay | Admitting: *Deleted

## 2022-07-30 NOTE — Telephone Encounter (Signed)
Ortho bundle 30 day call completed. °

## 2022-07-31 ENCOUNTER — Encounter: Payer: Self-pay | Admitting: Rehabilitative and Restorative Service Providers"

## 2022-07-31 ENCOUNTER — Ambulatory Visit (INDEPENDENT_AMBULATORY_CARE_PROVIDER_SITE_OTHER): Payer: PPO | Admitting: Rehabilitative and Restorative Service Providers"

## 2022-07-31 DIAGNOSIS — M25562 Pain in left knee: Secondary | ICD-10-CM

## 2022-07-31 DIAGNOSIS — M5432 Sciatica, left side: Secondary | ICD-10-CM

## 2022-07-31 DIAGNOSIS — R600 Localized edema: Secondary | ICD-10-CM

## 2022-07-31 DIAGNOSIS — M6281 Muscle weakness (generalized): Secondary | ICD-10-CM | POA: Diagnosis not present

## 2022-07-31 DIAGNOSIS — M25662 Stiffness of left knee, not elsewhere classified: Secondary | ICD-10-CM | POA: Diagnosis not present

## 2022-07-31 NOTE — Therapy (Signed)
OUTPATIENT PHYSICAL THERAPY TREATMENT NOTE   Patient Name: Kristy Henry MRN: [id number] DOB:Apr 20, 1951, 71 y.o., female Today's Date: 07/31/2022  PCP: Kristy Lass, MD REFERRING PROVIDER: Leandrew Koyanagi, MD  END OF SESSION:   PT End of Session - 07/31/22 1307     Visit Number 5    Number of Visits 16    Authorization Type [employer]    PT Start Time 1300    PT Stop Time 0938    PT Time Calculation (min) 55 min    Activity Tolerance Patient tolerated treatment well;Patient limited by pain;No increased pain    Behavior During Therapy WFL for tasks assessed/performed             Past Medical History:  Diagnosis Date   Anxiety    Arthritis    Depression    High cholesterol    HLD (hyperlipidemia)    Hypertension    Pneumonia    Primary sleep disturbance    Syncope and collapse 2019   Vision abnormalities    Past Surgical History:  Procedure Laterality Date   ABDOMINAL HYSTERECTOMY     BONE EXCISION     from bilateral hands   BREAST BIOPSY Right 09/25/2016   eye lid surgery     RECTOCELE REPAIR     TOTAL KNEE ARTHROPLASTY Left 06/29/2022   Procedure: LEFT TOTAL KNEE ARTHROPLASTY;  Surgeon: Kristy Koyanagi, MD;  Location: [place];  Service: Orthopedics;  Laterality: Left;   Patient Active Problem List   Diagnosis Date Noted   Status post total left knee replacement 06/29/2022   Primary osteoarthritis of left knee 06/03/2022   Non-restorative sleep 12/12/2018   Nocturia more than twice per night 12/12/2018   Snorings 12/12/2018   Menopausal hot flushes 12/12/2018   Excessive daytime sleepiness 12/12/2018    REFERRING DIAG: Status post total left knee replacement [Z96.652], Primary osteoarthritis of left knee [M17.12]  THERAPY DIAG:  Sciatica, left side  Muscle weakness (generalized)  Stiffness of left knee, not elsewhere classified  Acute pain of left knee  Localized edema  Rationale for Evaluation and Treatment  Rehabilitation  PERTINENT HISTORY: Anxiety, depression, Arthritis, HTN   PRECAUTIONS: none  SUBJECTIVE:  Kristy Henry reports good HEP compliance.  She is sleeping about 3 hours uninterrupted.  PAIN:  NPRS scale:  at worst in last week - 3-7/10 Pain location: left knee Pain description: achy, burning Aggravating factors: bending Relieving factors: ice, heat, pain meds   OBJECTIVE: (objective measures completed at initial evaluation unless otherwise dated)    DIAGNOSTIC FINDINGS:  07/14/2022 review: Left knee arthroplasty without immediate postoperative complication.   PATIENT SURVEYS:  07/14/2022 FOTO 52.48%, 63% in 16 visits   COGNITION: 07/14/2022           Overall cognitive status: Within functional limits for tasks assessed                          SENSATION: 07/14/2022 [facility]   EDEMA:  07/21/22: Circumferential: Left 42 centimeters                        Right 35 centimeters  07/14/22: Circumferential: R:49"  L: 35"   POSTURE:  07/14/2022 rounded shoulders and forward head   PALPATION: 07/14/2022 Tenderness surrounding surgical site.    LOWER EXTREMITY ROM:   Passive ROM Right 07/14/2022 Left 07/14/2022 Left 07/24/2022 Left 07/31/2022  Knee flexion 128 80/90 98  AROM 112  Knee extension 0 -10 (lacking)   AROM -3   (Blank rows = not tested)   LOWER EXTREMITY MMT:   MMT Right 07/21/22 Left 07/21/22 Lt/Rt in pounds 07/31/2022  Knee flexion  5/5 3/5    Knee extension  5/5 3/5  18.5/64.5   (Blank rows = not tested)     GAIT: 07/14/2022 Distance walked: 31f  Assistive device utilized: Single point cane Level of assistance: Complete Independence Comments: Pt walks with decreased stance time on L LE.      TODAY'S TREATMENT: 07/31/2022: Recumbent bike Seat 5 for 4 minutes and Seat 4 minutes AAROM to full revolutions [place] knee flexion AROM 1 minute AAROM Seated knee flexion (Rt pushes Lt into flexion) 10X 10 seconds Quadriceps sets with heel prop 2 sets of  10 for 5 seconds Seated straight leg raises 3 sets of 5 with 1#  Vaso Lt knee Medium Pressure 10 minutes 34*   07/28/2022:  TherEx:  Nustep Lvl 5 8 mins UE/LE Leg press single leg , lying supine 12 lbs x 12 (mild knee pain) Seated Lt knee ROM c Rt leg overpressure 15 sec x 5 Seated LAQ c end range pause in flexion/extension 2 x 10 1.5 lbs Seated Lt knee isometric low contraction level due to pain 5 sec alternating extension and flexion in mid range Seated quad set 5 sec hold x 10 bilateral (try SLR next visit in sitting, elevated mat standing)   Vasopneumatic Medium compression, 34 degrees, x 10 minutes in elevation   07/24/2022:  TherEx:  Nustep Lvl 5 7 mins UE/LE Seated LAQ c end range pause in flexion/extension x 15 Seated Lt knee isometric low contraction level due to pain 5 sec alternating extension and flexion in mid range Supine quad set 5 sec hold x 10 bilateral Supine heel slide Lt AROM to tolerance 5 sec x 10    Vasopneumatic Medium compression, 34 degrees, x 10 minutes in elevation   PATIENT EDUCATION:  07/21/2022 Education details: HEP update Person educated: Patient and Parent Education method: ECustomer service managerEducation comprehension: verbalized understanding and returned demonstration     HOME EXERCISE PROGRAM: Access Code: [email or web address]: [email or web address] Date: 07/21/2022 Prepared by: JKearney Hard Exercises - Supine Quad Set  - 3 x daily - 7 x weekly - 2 sets - 10 reps - 5 seconds hold - Supine Heel Slide with Strap  - 3 x daily - 7 x weekly - 10 reps - 5 seconds hold - Seated Long Arc Quad  - 3 x daily - 7 x weekly - 2 sets - 10 reps - 5 seconds hold - Seated Knee Flexion AAROM  - 3 x daily - 7 x weekly - 10 reps - 5 seconds hold - Sit to Stand with Counter Support  - 3 x daily - 7 x weekly - 3 sets - 10 reps    ASSESSMENT:   CLINICAL IMPRESSION:  Kristy Henry making good progress with her AROM which was  assessed at 3-0-112 today.  Edema limits full extension by 3 degrees and edema reduction should result in another 3-5 degrees of flexion AROM.  We discussed the importance of quadriceps strength in edema reduction, pain reduction and increased function given her 28-29% quadriceps strength as compared to the uninvolved Rt side.  Continue POC with more strength and functional progressions as appropriate.   OBJECTIVE IMPAIRMENTS: decreased activity tolerance, difficulty walking, decreased balance, decreased endurance, decreased mobility, decreased ROM, decreased strength, impaired flexibility, impaired LE use, postural  dysfunction, and pain.   ACTIVITY LIMITATIONS: bending, lifting, carry, locomotion, cleaning, community activity, driving, and or occupation   PERSONAL FACTORS:  Anxiety, depression, Arthritis, HTN  are also affecting patient's functional outcome.   REHAB POTENTIAL: Good   CLINICAL DECISION MAKING: Stable/uncomplicated   EVALUATION COMPLEXITY: Low       GOALS: Short term PT Goals Target date: 08/11/2022 SHORT TERM GOALS: Target date: 08/13/2022  Will be compliant with appropriate progressive HEP  Baseline: Goal status: On-going 07/31/22     Will be able to complete bed mobility and functional transfers from standard height chair with no more than general S  Baseline:  Goal status: on going - assessed 07/31/2022    R knee extension AROM to be no more than 5 degrees and flexion AROM to be no less than 115 degrees  Baseline:  Goal status: Partially Met - assessed 07/31/2022    Pain in R knee to be no more than 4/10 at worst  Baseline:  Goal status: On going - assessed 07/31/2022     Long term PT goals Target date: 09/08/2022 Pt will improve ROM to [facility] to improve functional mobility Baseline: Goal status: New       2. MMT to improve by at least 1 grade in all weak groups       Baseline:       Goal status: INITIAL Pt will improve FOTO to at least 63% functional to  show improved function Baseline: Goal status: New Pt will reduce pain by overall 50% overall with usual activity Baseline: Goal status: New Pt will reduce pain to overall less than 2-3/10 with usual activity and work activity. Baseline: Goal status: New Pt will be able to ambulate community distances at least 1000 ft WNL gait pattern without complaints Baseline: Goal status: New   PLAN: PT FREQUENCY: 1-3 times per week    PT DURATION: 6-8 weeks   PLANNED INTERVENTIONS (unless contraindicated): aquatic PT, Canalith repositioning, cryotherapy, Electrical stimulation, Iontophoresis with 4 mg/ml dexamethasome, Moist heat, traction, Ultrasound, gait training, Therapeutic exercise, balance training, neuromuscular re-education, patient/family education, prosthetic training, manual techniques, passive ROM, dry needling, taping, vasopnuematic device, vestibular, spinal manipulations, joint manipulations   PLAN FOR NEXT SESSION: Quadriceps strength, balance and functional progressions along with edema control.   Farley Ly PT, MPT 07/31/22  4:32 PM

## 2022-08-03 ENCOUNTER — Encounter: Payer: PPO | Admitting: Physical Therapy

## 2022-08-04 ENCOUNTER — Ambulatory Visit (INDEPENDENT_AMBULATORY_CARE_PROVIDER_SITE_OTHER): Payer: PPO | Admitting: Rehabilitative and Restorative Service Providers"

## 2022-08-04 ENCOUNTER — Encounter: Payer: Self-pay | Admitting: Rehabilitative and Restorative Service Providers"

## 2022-08-04 DIAGNOSIS — M5432 Sciatica, left side: Secondary | ICD-10-CM

## 2022-08-04 DIAGNOSIS — R600 Localized edema: Secondary | ICD-10-CM

## 2022-08-04 DIAGNOSIS — M25662 Stiffness of left knee, not elsewhere classified: Secondary | ICD-10-CM

## 2022-08-04 DIAGNOSIS — M25562 Pain in left knee: Secondary | ICD-10-CM

## 2022-08-04 DIAGNOSIS — M6281 Muscle weakness (generalized): Secondary | ICD-10-CM | POA: Diagnosis not present

## 2022-08-04 NOTE — Therapy (Signed)
OUTPATIENT PHYSICAL THERAPY TREATMENT NOTE   Patient Name: Kristy Henry MRN: [id number] DOB:10/16/1950, 71 y.o., female Today's Date: 08/04/2022  PCP: Kathyrn Lass, MD REFERRING PROVIDER: Leandrew Koyanagi, MD  END OF SESSION:   PT End of Session - 08/04/22 1334     Visit Number 6    Number of Visits 16    Authorization Type [employer]    PT Start Time 1302    PT Stop Time 1352    PT Time Calculation (min) 50 min    Activity Tolerance Patient tolerated treatment well    Behavior During Therapy WFL for tasks assessed/performed              Past Medical History:  Diagnosis Date   Anxiety    Arthritis    Depression    High cholesterol    HLD (hyperlipidemia)    Hypertension    Pneumonia    Primary sleep disturbance    Syncope and collapse 2019   Vision abnormalities    Past Surgical History:  Procedure Laterality Date   ABDOMINAL HYSTERECTOMY     BONE EXCISION     from bilateral hands   BREAST BIOPSY Right 09/25/2016   eye lid surgery     RECTOCELE REPAIR     TOTAL KNEE ARTHROPLASTY Left 06/29/2022   Procedure: LEFT TOTAL KNEE ARTHROPLASTY;  Surgeon: Leandrew Koyanagi, MD;  Location: [place];  Service: Orthopedics;  Laterality: Left;   Patient Active Problem List   Diagnosis Date Noted   Status post total left knee replacement 06/29/2022   Primary osteoarthritis of left knee 06/03/2022   Non-restorative sleep 12/12/2018   Nocturia more than twice per night 12/12/2018   Snorings 12/12/2018   Menopausal hot flushes 12/12/2018   Excessive daytime sleepiness 12/12/2018    REFERRING DIAG: Status post total left knee replacement [Z96.652], Primary osteoarthritis of left knee [M17.12]  THERAPY DIAG:  Stiffness of left knee, not elsewhere classified  Acute pain of left knee  Localized edema  Muscle weakness (generalized)  Sciatica, left side  Rationale for Evaluation and Treatment Rehabilitation  PERTINENT HISTORY: Anxiety, depression,  Arthritis, HTN   PRECAUTIONS: none  SUBJECTIVE:   Pt indicated getting a UTI last week with the medical care complications causing her some stress. Pt indicated feeling like she could walk without cane.  Pt indicated sleeping better.   PAIN:  NPRS scale:  at worst 5/10 Pain location: left knee Pain description: achy, burning Aggravating factors: bending Relieving factors: ice, heat, pain meds   OBJECTIVE: (objective measures completed at initial evaluation unless otherwise dated)    DIAGNOSTIC FINDINGS:  07/14/2022 review: Left knee arthroplasty without immediate postoperative complication.   PATIENT SURVEYS:  07/14/2022 FOTO 52.48%, 63% in 16 visits   COGNITION: 07/14/2022           Overall cognitive status: Within functional limits for tasks assessed                          SENSATION: 07/14/2022 [facility]   EDEMA:  07/21/22: Circumferential: Left 42 centimeters                        Right 35 centimeters  07/14/22: Circumferential: R:49"  L: 35"   POSTURE:  07/14/2022 rounded shoulders and forward head   PALPATION: 07/14/2022 Tenderness surrounding surgical site.    LOWER EXTREMITY ROM:   Passive ROM Right 07/14/2022 Left 07/14/2022 Left 07/24/2022  Left 07/31/2022  Knee flexion 128 80/90 98  AROM 112  Knee extension 0 -10 (lacking)   AROM -3   (Blank rows = not tested)   LOWER EXTREMITY MMT:   MMT Right 07/21/22 Left 07/21/22 Lt/Rt in pounds 07/31/2022  Knee flexion  5/5 3/5    Knee extension  5/5 3/5  18.5/64.5   (Blank rows = not tested)     GAIT: 08/04/2022:  Independent ambulation within clinic.   07/14/2022 Distance walked: 51f  Assistive device utilized: Single point cane Level of assistance: Complete Independence Comments: Pt walks with decreased stance time on L LE.      TODAY'S TREATMENT: 08/04/2022 Therex: Recumbent bike Lvl 2 8 mins, seat 5 Incline Calf stretch 30 sec x 3 bilateral (heels on ground) Leg press single leg 37 lbs 2 x 15 Lt  knee flexion stretch c overpressure Rt leg 15 sec x 5 Step up Lt leg 4 inch 2 x 10   Neuro Re-ed Tandem stance 1 min x 1 bilateral, on foam 1 min x 1 bilateral - occasional hand assist required   Vasopneumatic Medium compression, 34 degrees, x 10 minutes in elevation  07/31/2022: Recumbent bike Seat 5 for 4 minutes and Seat 4 minutes AAROM to full revolutions Tailgate knee flexion AROM 1 minute AAROM Seated knee flexion (Rt pushes Lt into flexion) 10X 10 seconds Quadriceps sets with heel prop 2 sets of 10 for 5 seconds Seated straight leg raises 3 sets of 5 with 1#  Vaso Lt knee Medium Pressure 10 minutes 34*   07/28/2022:  TherEx:  Nustep Lvl 5 8 mins UE/LE Leg press single leg , lying supine 12 lbs x 12 (mild knee pain) Seated Lt knee ROM c Rt leg overpressure 15 sec x 5 Seated LAQ c end range pause in flexion/extension 2 x 10 1.5 lbs Seated Lt knee isometric low contraction level due to pain 5 sec alternating extension and flexion in mid range Seated quad set 5 sec hold x 10 bilateral (try SLR next visit in sitting, elevated mat standing)   Vasopneumatic Medium compression, 34 degrees, x 10 minutes in elevation   PATIENT EDUCATION:  07/21/2022 Education details: HEP update Person educated: Patient and Parent Education method: ECustomer service managerEducation comprehension: verbalized understanding and returned demonstration     HOME EXERCISE PROGRAM: Access Code: [email or web address]: [email or web address] Date: 07/21/2022 Prepared by: JKearney Hard Exercises - Supine Quad Set  - 3 x daily - 7 x weekly - 2 sets - 10 reps - 5 seconds hold - Supine Heel Slide with Strap  - 3 x daily - 7 x weekly - 10 reps - 5 seconds hold - Seated Long Arc Quad  - 3 x daily - 7 x weekly - 2 sets - 10 reps - 5 seconds hold - Seated Knee Flexion AAROM  - 3 x daily - 7 x weekly - 10 reps - 5 seconds hold - Sit to Stand with Counter Support  - 3 x daily - 7 x weekly -  3 sets - 10 reps    ASSESSMENT:   CLINICAL IMPRESSION:  Positive improvements in overall functional and ambulation to this point.  Continued focus in clinic on building strength and dynamic stability for ambulation warranted.    OBJECTIVE IMPAIRMENTS: decreased activity tolerance, difficulty walking, decreased balance, decreased endurance, decreased mobility, decreased ROM, decreased strength, impaired flexibility, impaired LE use, postural dysfunction, and pain.   ACTIVITY LIMITATIONS: bending, lifting, carry, locomotion, cleaning, community activity, driving,  and or occupation   PERSONAL FACTORS:  Anxiety, depression, Arthritis, HTN  are also affecting patient's functional outcome.   REHAB POTENTIAL: Good   CLINICAL DECISION MAKING: Stable/uncomplicated   EVALUATION COMPLEXITY: Low       GOALS: Short term PT Goals Target date: 08/11/2022 SHORT TERM GOALS: Target date: 08/13/2022  Will be compliant with appropriate progressive HEP  Baseline: Goal status: On-going 07/31/22     Will be able to complete bed mobility and functional transfers from standard height chair with no more than general S  Baseline:  Goal status: on going - assessed 07/31/2022    Rt knee extension AROM to be no more than 5 degrees and flexion AROM to be no less than 115 degrees  Baseline:  Goal status: Partially Met - assessed 07/31/2022    Pain in R knee to be no more than 4/10 at worst  Baseline:  Goal status: On going - assessed 07/31/2022     Long term PT goals Target date: 09/08/2022 Pt will improve ROM to [facility] to improve functional mobility Baseline: Goal status: New       2. MMT to improve by at least 1 grade in all weak groups       Baseline:       Goal status: INITIAL Pt will improve FOTO to at least 63% functional to show improved function Baseline: Goal status: New Pt will reduce pain by overall 50% overall with usual activity Baseline: Goal status: New Pt will reduce pain to  overall less than 2-3/10 with usual activity and work activity. Baseline: Goal status: New Pt will be able to ambulate community distances at least 1000 ft WNL gait pattern without complaints Baseline: Goal status: New   PLAN: PT FREQUENCY: 1-3 times per week    PT DURATION: 6-8 weeks   PLANNED INTERVENTIONS (unless contraindicated): aquatic PT, Canalith repositioning, cryotherapy, Electrical stimulation, Iontophoresis with 4 mg/ml dexamethasome, Moist heat, traction, Ultrasound, gait training, Therapeutic exercise, balance training, neuromuscular re-education, patient/family education, prosthetic training, manual techniques, passive ROM, dry needling, taping, vasopnuematic device, vestibular, spinal manipulations, joint manipulations   PLAN FOR NEXT SESSION: Quad strengthening, compliant surface balance.    Scot Jun, PT, DPT, OCS, ATC 08/04/22  1:42 PM

## 2022-08-05 ENCOUNTER — Other Ambulatory Visit: Payer: Self-pay | Admitting: Physician Assistant

## 2022-08-05 ENCOUNTER — Telehealth: Payer: Self-pay | Admitting: Orthopaedic Surgery

## 2022-08-05 DIAGNOSIS — Z1389 Encounter for screening for other disorder: Secondary | ICD-10-CM | POA: Diagnosis not present

## 2022-08-05 DIAGNOSIS — Z0000 Encounter for general adult medical examination without abnormal findings: Secondary | ICD-10-CM | POA: Diagnosis not present

## 2022-08-05 DIAGNOSIS — Z6831 Body mass index (BMI) 31.0-31.9, adult: Secondary | ICD-10-CM | POA: Diagnosis not present

## 2022-08-05 MED ORDER — HYDROMORPHONE HCL 2 MG PO TABS: ORAL_TABLET | ORAL | 0 refills | Status: DC

## 2022-08-05 NOTE — Telephone Encounter (Signed)
sent 

## 2022-08-05 NOTE — Telephone Encounter (Signed)
Pt called requesting a refill of hydromorphone. Please send to pharmacy on file. Pt phone number is [phone].

## 2022-08-07 ENCOUNTER — Ambulatory Visit (INDEPENDENT_AMBULATORY_CARE_PROVIDER_SITE_OTHER): Payer: PPO | Admitting: Rehabilitative and Restorative Service Providers"

## 2022-08-07 ENCOUNTER — Encounter: Payer: Self-pay | Admitting: Rehabilitative and Restorative Service Providers"

## 2022-08-07 DIAGNOSIS — R600 Localized edema: Secondary | ICD-10-CM

## 2022-08-07 DIAGNOSIS — M6281 Muscle weakness (generalized): Secondary | ICD-10-CM

## 2022-08-07 DIAGNOSIS — E559 Vitamin D deficiency, unspecified: Secondary | ICD-10-CM | POA: Diagnosis not present

## 2022-08-07 DIAGNOSIS — E7800 Pure hypercholesterolemia, unspecified: Secondary | ICD-10-CM | POA: Diagnosis not present

## 2022-08-07 DIAGNOSIS — E669 Obesity, unspecified: Secondary | ICD-10-CM | POA: Diagnosis not present

## 2022-08-07 DIAGNOSIS — I10 Essential (primary) hypertension: Secondary | ICD-10-CM | POA: Diagnosis not present

## 2022-08-07 DIAGNOSIS — M25662 Stiffness of left knee, not elsewhere classified: Secondary | ICD-10-CM | POA: Diagnosis not present

## 2022-08-07 DIAGNOSIS — M25562 Pain in left knee: Secondary | ICD-10-CM | POA: Diagnosis not present

## 2022-08-07 DIAGNOSIS — M5432 Sciatica, left side: Secondary | ICD-10-CM | POA: Diagnosis not present

## 2022-08-07 NOTE — Therapy (Signed)
OUTPATIENT PHYSICAL THERAPY TREATMENT NOTE   Patient Name: Kristy Henry MRN: [id number] DOB:01-Jan-1951, 71 y.o., female Today's Date: 08/07/2022  PCP: Kathyrn Lass, MD REFERRING PROVIDER: Leandrew Koyanagi, MD  END OF SESSION:   PT End of Session - 08/07/22 1254     Visit Number 7    Number of Visits 16    Authorization Type [employer]    PT Start Time 1252    PT Stop Time 1345    PT Time Calculation (min) 53 min    Activity Tolerance Patient tolerated treatment well    Behavior During Therapy WFL for tasks assessed/performed               Past Medical History:  Diagnosis Date   Anxiety    Arthritis    Depression    High cholesterol    HLD (hyperlipidemia)    Hypertension    Pneumonia    Primary sleep disturbance    Syncope and collapse 2019   Vision abnormalities    Past Surgical History:  Procedure Laterality Date   ABDOMINAL HYSTERECTOMY     BONE EXCISION     from bilateral hands   BREAST BIOPSY Right 09/25/2016   eye lid surgery     RECTOCELE REPAIR     TOTAL KNEE ARTHROPLASTY Left 06/29/2022   Procedure: LEFT TOTAL KNEE ARTHROPLASTY;  Surgeon: Leandrew Koyanagi, MD;  Location: [place];  Service: Orthopedics;  Laterality: Left;   Patient Active Problem List   Diagnosis Date Noted   Status post total left knee replacement 06/29/2022   Primary osteoarthritis of left knee 06/03/2022   Non-restorative sleep 12/12/2018   Nocturia more than twice per night 12/12/2018   Snorings 12/12/2018   Menopausal hot flushes 12/12/2018   Excessive daytime sleepiness 12/12/2018    REFERRING DIAG: Status post total left knee replacement [Z96.652], Primary osteoarthritis of left knee [M17.12]  THERAPY DIAG:  Stiffness of left knee, not elsewhere classified  Acute pain of left knee  Localized edema  Muscle weakness (generalized)  Sciatica, left side  Rationale for Evaluation and Treatment Rehabilitation  PERTINENT HISTORY: Anxiety, depression,  Arthritis, HTN   PRECAUTIONS: none  SUBJECTIVE:   Pt indicated maybe not having pain medicine today and didn't feel worse (couldn't remember taking it or not)   PAIN:  NPRS scale:  up to 4/10 Pain location: left knee Pain description: achy, burning Aggravating factors: bending Relieving factors: ice, heat, pain meds   OBJECTIVE: (objective measures completed at initial evaluation unless otherwise dated)    DIAGNOSTIC FINDINGS:  07/14/2022 review: Left knee arthroplasty without immediate postoperative complication.   PATIENT SURVEYS:  07/14/2022 FOTO 52.48%, 63% in 16 visits   COGNITION: 07/14/2022           Overall cognitive status: Within functional limits for tasks assessed                          SENSATION: 07/14/2022 [facility]   EDEMA:  07/21/22: Circumferential: Left 42 centimeters                        Right 35 centimeters   07/14/22: Circumferential: Rt:49"  Lt: 35"   POSTURE:  07/14/2022 rounded shoulders and forward head   PALPATION: 07/14/2022 Tenderness surrounding surgical site.    LOWER EXTREMITY ROM:   Passive ROM Right 07/14/2022 Left 07/14/2022 Left 07/24/2022 Left 07/31/2022  Knee flexion 128 80/90 98  AROM  112  Knee extension 0 -10 (lacking)   AROM -3   (Blank rows = not tested)   LOWER EXTREMITY MMT:   MMT Right 07/21/22 Left 07/21/22 Lt/Rt in pounds 07/31/2022 Left 08/07/2022  Knee flexion  5/5 3/5     Knee extension  5/5 3/5  18.5/64.5 29.1, 31.1 lbs    (Blank rows = not tested)     GAIT: 08/04/2022:  Independent ambulation within clinic.   07/14/2022 Distance walked: 65f  Assistive device utilized: Single point cane Level of assistance: Complete Independence Comments: Pt walks with decreased stance time on L LE.      TODAY'S TREATMENT: 08/07/2022 Therex: Recumbent bike Lvl 2 8 mins, seat 4 Incline Calf stretch 30 sec x 3 bilateral (heels on ground) Leg press single leg 37 lbs 2 x 15, performed bilateral Seated Lt LAQ 4 lbs 2  x 15 c pause at end range Step up Lt leg 6 inch x 15 c slow lowering focus Sit to stand to sit 20 inch table x 10 c slow lowering  Vasopneumatic Medium compression, 34 degrees, x 10 minutes in elevation  08/04/2022 Therex: Recumbent bike Lvl 2 8 mins, seat 5 Incline Calf stretch 30 sec x 3 bilateral (heels on ground) Leg press single leg 37 lbs 2 x 15 Lt knee flexion stretch c overpressure Rt leg 15 sec x 5 Step up Lt leg 4 inch 2 x 10    Vasopneumatic Medium compression, 34 degrees, x 10 minutes in elevation  07/31/2022: Recumbent bike Seat 5 for 4 minutes and Seat 4 minutes AAROM to full revolutions Tailgate knee flexion AROM 1 minute AAROM Seated knee flexion (Rt pushes Lt into flexion) 10X 10 seconds Quadriceps sets with heel prop 2 sets of 10 for 5 seconds Seated straight leg raises 3 sets of 5 with 1#  Vaso Lt knee Medium Pressure 10 minutes 34*  PATIENT EDUCATION:  07/21/2022 Education details: HEP update Person educated: Patient and Parent Education method: ECustomer service managerEducation comprehension: verbalized understanding and returned demonstration     HOME EXERCISE PROGRAM: Access Code: [email or web address]: [email or web address] Date: 07/21/2022 Prepared by: JKearney Hard Exercises - Supine Quad Set  - 3 x daily - 7 x weekly - 2 sets - 10 reps - 5 seconds hold - Supine Heel Slide with Strap  - 3 x daily - 7 x weekly - 10 reps - 5 seconds hold - Seated Long Arc Quad  - 3 x daily - 7 x weekly - 2 sets - 10 reps - 5 seconds hold - Seated Knee Flexion AAROM  - 3 x daily - 7 x weekly - 10 reps - 5 seconds hold - Sit to Stand with Counter Support  - 3 x daily - 7 x weekly - 3 sets - 10 reps    ASSESSMENT:   CLINICAL IMPRESSION:  Continued improvement in active extension and strengthening noted in objective data update.  Pt to continue to benefit from progressive strengthening and transitioning into WB activity to promote stair navigation  and other WB activity of daily life.  Making appropriate progress at this time.    OBJECTIVE IMPAIRMENTS: decreased activity tolerance, difficulty walking, decreased balance, decreased endurance, decreased mobility, decreased ROM, decreased strength, impaired flexibility, impaired LE use, postural dysfunction, and pain.   ACTIVITY LIMITATIONS: bending, lifting, carry, locomotion, cleaning, community activity, driving, and or occupation   PERSONAL FACTORS:  Anxiety, depression, Arthritis, HTN  are also affecting patient's functional outcome.   REHAB POTENTIAL: Good  CLINICAL DECISION MAKING: Stable/uncomplicated   EVALUATION COMPLEXITY: Low       GOALS: Short term PT Goals Target date: 08/11/2022 SHORT TERM GOALS: Target date: 08/13/2022  Will be compliant with appropriate progressive HEP  Baseline: Goal status: On-going 07/31/22     Will be able to complete bed mobility and functional transfers from standard height chair with no more than general S  Baseline:  Goal status: on going - assessed 07/31/2022    Rt knee extension AROM to be no more than 5 degrees and flexion AROM to be no less than 115 degrees  Baseline:  Goal status: Partially Met - assessed 07/31/2022    Pain in R knee to be no more than 4/10 at worst  Baseline:  Goal status: On going - assessed 07/31/2022     Long term PT goals Target date: 09/08/2022 Pt will improve ROM to [facility] to improve functional mobility Baseline: Goal status: New       2. MMT to improve by at least 1 grade in all weak groups       Baseline:       Goal status: INITIAL Pt will improve FOTO to at least 63% functional to show improved function Baseline: Goal status: New Pt will reduce pain by overall 50% overall with usual activity Baseline: Goal status: New Pt will reduce pain to overall less than 2-3/10 with usual activity and work activity. Baseline: Goal status: New Pt will be able to ambulate community distances at least 1000  ft WNL gait pattern without complaints Baseline: Goal status: New   PLAN: PT FREQUENCY: 1-3 times per week    PT DURATION: 6-8 weeks   PLANNED INTERVENTIONS (unless contraindicated): aquatic PT, Canalith repositioning, cryotherapy, Electrical stimulation, Iontophoresis with 4 mg/ml dexamethasome, Moist heat, traction, Ultrasound, gait training, Therapeutic exercise, balance training, neuromuscular re-education, patient/family education, prosthetic training, manual techniques, passive ROM, dry needling, taping, vasopnuematic device, vestibular, spinal manipulations, joint manipulations   PLAN FOR NEXT SESSION:  LTG/STM assessment   Scot Jun, PT, DPT, OCS, ATC 08/07/22  1:37 PM

## 2022-08-11 ENCOUNTER — Ambulatory Visit (INDEPENDENT_AMBULATORY_CARE_PROVIDER_SITE_OTHER): Payer: PPO | Admitting: Physician Assistant

## 2022-08-11 ENCOUNTER — Encounter: Payer: Self-pay | Admitting: Orthopaedic Surgery

## 2022-08-11 ENCOUNTER — Ambulatory Visit (INDEPENDENT_AMBULATORY_CARE_PROVIDER_SITE_OTHER): Payer: PPO

## 2022-08-11 ENCOUNTER — Encounter: Payer: Self-pay | Admitting: Rehabilitative and Restorative Service Providers"

## 2022-08-11 ENCOUNTER — Ambulatory Visit (INDEPENDENT_AMBULATORY_CARE_PROVIDER_SITE_OTHER): Payer: PPO | Admitting: Rehabilitative and Restorative Service Providers"

## 2022-08-11 DIAGNOSIS — Z96652 Presence of left artificial knee joint: Secondary | ICD-10-CM

## 2022-08-11 DIAGNOSIS — M5432 Sciatica, left side: Secondary | ICD-10-CM | POA: Diagnosis not present

## 2022-08-11 DIAGNOSIS — M25662 Stiffness of left knee, not elsewhere classified: Secondary | ICD-10-CM

## 2022-08-11 DIAGNOSIS — R600 Localized edema: Secondary | ICD-10-CM | POA: Diagnosis not present

## 2022-08-11 DIAGNOSIS — M25562 Pain in left knee: Secondary | ICD-10-CM | POA: Diagnosis not present

## 2022-08-11 DIAGNOSIS — M6281 Muscle weakness (generalized): Secondary | ICD-10-CM | POA: Diagnosis not present

## 2022-08-11 NOTE — Progress Notes (Unsigned)
   Post-Op Visit Note   Patient: Kristy Henry           Date of Birth: May 17, 1951           MRN: [id number] Visit Date: 08/11/2022 PCP: Kathyrn Lass, MD   Assessment & Plan:  Chief Complaint:  Chief Complaint  Patient presents with   Left Knee - Follow-up    Left total knee arthroplasty 06/29/2022   Visit Diagnoses:  1. Status post total left knee replacement     Plan: Patient is a pleasant 71 year old female who comes in today 7 weeks status post left total knee replacement 06/29/2022.  She is doing great.  She is still in physical therapy making great progress.  She is taking Advil Walda twice daily for pain.  She notes that she was taking a baby aspirin twice daily but finished this a while ago.  Examination of the left knee reveals range of motion from 0 to 110 degrees.  She is stable valgus varus stress.  She is neurovascular intact distally.  At this point, she will continue with physical therapy as well as a home exercise program.  Dental prophylaxis reinforced.  Follow-up in 6 weeks for recheck.  Call with concerns or questions.  Follow-Up Instructions: Return in about 5 weeks (around 09/15/2022).   Orders:  Orders Placed This Encounter  Procedures   XR Knee 1-2 Views Left   No orders of the defined types were placed in this encounter.   Imaging: XR Knee 1-2 Views Left  Result Date: 08/12/2022 Well-seated prosthesis without complication   PMFS History: Patient Active Problem List   Diagnosis Date Noted   Status post total left knee replacement 06/29/2022   Primary osteoarthritis of left knee 06/03/2022   Non-restorative sleep 12/12/2018   Nocturia more than twice per night 12/12/2018   Snorings 12/12/2018   Menopausal hot flushes 12/12/2018   Excessive daytime sleepiness 12/12/2018   Past Medical History:  Diagnosis Date   Anxiety    Arthritis    Depression    High cholesterol    HLD (hyperlipidemia)    Hypertension    Pneumonia    Primary sleep  disturbance    Syncope and collapse 2019   Vision abnormalities     Family History  Problem Relation Age of Onset   Heart disease Mother    Cancer Father    Colon cancer Father    Breast cancer Sister 34   High blood pressure Sister    Diabetes type II Brother    Diabetes type II Brother     Past Surgical History:  Procedure Laterality Date   ABDOMINAL HYSTERECTOMY     BONE EXCISION     from bilateral hands   BREAST BIOPSY Right 09/25/2016   eye lid surgery     RECTOCELE REPAIR     TOTAL KNEE ARTHROPLASTY Left 06/29/2022   Procedure: LEFT TOTAL KNEE ARTHROPLASTY;  Surgeon: Leandrew Koyanagi, MD;  Location: [place];  Service: Orthopedics;  Laterality: Left;   Social History   Occupational History   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

## 2022-08-11 NOTE — Therapy (Signed)
OUTPATIENT PHYSICAL THERAPY TREATMENT NOTE   Patient Name: Kristy Henry MRN: [id number] DOB:21-Jan-1951, 71 y.o., female Today's Date: 08/11/2022  PCP: Kathyrn Lass, MD REFERRING PROVIDER: Leandrew Koyanagi, MD  END OF SESSION:   PT End of Session - 08/11/22 1108     Visit Number 8    Number of Visits 16    Authorization Type [employer]    PT Start Time 1100    PT Stop Time 1140    PT Time Calculation (min) 40 min    Activity Tolerance Patient tolerated treatment well    Behavior During Therapy WFL for tasks assessed/performed                Past Medical History:  Diagnosis Date   Anxiety    Arthritis    Depression    High cholesterol    HLD (hyperlipidemia)    Hypertension    Pneumonia    Primary sleep disturbance    Syncope and collapse 2019   Vision abnormalities    Past Surgical History:  Procedure Laterality Date   ABDOMINAL HYSTERECTOMY     BONE EXCISION     from bilateral hands   BREAST BIOPSY Right 09/25/2016   eye lid surgery     RECTOCELE REPAIR     TOTAL KNEE ARTHROPLASTY Left 06/29/2022   Procedure: LEFT TOTAL KNEE ARTHROPLASTY;  Surgeon: Leandrew Koyanagi, MD;  Location: [place];  Service: Orthopedics;  Laterality: Left;   Patient Active Problem List   Diagnosis Date Noted   Status post total left knee replacement 06/29/2022   Primary osteoarthritis of left knee 06/03/2022   Non-restorative sleep 12/12/2018   Nocturia more than twice per night 12/12/2018   Snorings 12/12/2018   Menopausal hot flushes 12/12/2018   Excessive daytime sleepiness 12/12/2018    REFERRING DIAG: Status post total left knee replacement [Z96.652], Primary osteoarthritis of left knee [M17.12]  THERAPY DIAG:  Stiffness of left knee, not elsewhere classified  Acute pain of left knee  Localized edema  Muscle weakness (generalized)  Sciatica, left side  Rationale for Evaluation and Treatment Rehabilitation  PERTINENT HISTORY: Anxiety,  depression, Arthritis, HTN   PRECAUTIONS: none  SUBJECTIVE:   Pt indicated waking up with best feeling today since the surgery.  She felt "almost normal."   PAIN:  NPRS scale:  0/10 upon arrival.  Pain location: left knee Pain description:  Aggravating factors: bending Relieving factors: ice, heat, pain meds   OBJECTIVE: (objective measures completed at initial evaluation unless otherwise dated)    DIAGNOSTIC FINDINGS:  07/14/2022 review: Left knee arthroplasty without immediate postoperative complication.   PATIENT SURVEYS:  07/14/2022 FOTO 52.48%, 63% in 16 visits   COGNITION: 07/14/2022           Overall cognitive status: Within functional limits for tasks assessed                          SENSATION: 07/14/2022 [facility]   EDEMA:  07/21/22: Circumferential: Left 42 centimeters                        Right 35 centimeters   07/14/22: Circumferential: Rt:49"  Lt: 35"   POSTURE:  07/14/2022 rounded shoulders and forward head   PALPATION: 07/14/2022 Tenderness surrounding surgical site.    LOWER EXTREMITY ROM:   Passive ROM Right 07/14/2022 Left 07/14/2022 Left 07/24/2022 Left 07/31/2022  Knee flexion 128 80/90 98  AROM 112  Knee extension 0 -10 (lacking)   AROM -3   (Blank rows = not tested)   LOWER EXTREMITY MMT:   MMT Right 07/21/22 Left 07/21/22 Lt/Rt in pounds 07/31/2022 Left 08/07/2022  Knee flexion  5/5 3/5     Knee extension  5/5 3/5  18.5/64.5 29.1, 31.1 lbs    (Blank rows = not tested)     GAIT: 08/04/2022:  Independent ambulation within clinic.   07/14/2022 Distance walked: 64f  Assistive device utilized: Single point cane Level of assistance: Complete Independence Comments: Pt walks with decreased stance time on L LE.      TODAY'S TREATMENT: 08/11/2022 Therex Recumbent bike lvl 2 8 mins seat 6 Lateral step down 4 inch slow 2 x 10 WB on Lt Knee extension machine double leg up, Lt leg lowering 5 lbs 2 x 10 slow lowering focus   Neuro  Re-ed Tandem ambulation fwd/back on foam 6 ft x 10 each way c occasional hand assist SLS c contralateral leg reaching light touch fwd, lateral and back x 10 on Lt leg  08/07/2022 Therex: Recumbent bike Lvl 2 8 mins, seat 4 Incline Calf stretch 30 sec x 3 bilateral (heels on ground) Leg press single leg 37 lbs 2 x 15, performed bilateral Step up Lt leg 6 inch x 15 c slow lowering focus Sit to stand to sit 20 inch table x 10 c slow lowering  Vasopneumatic Medium compression, 34 degrees, x 10 minutes in elevation  08/04/2022 Therex: Recumbent bike Lvl 2 8 mins, seat 5 Incline Calf stretch 30 sec x 3 bilateral (heels on ground) Leg press single leg 37 lbs 2 x 15 Lt knee flexion stretch c overpressure Rt leg 15 sec x 5 Step up Lt leg 4 inch 2 x 10    Vasopneumatic Medium compression, 34 degrees, x 10 minutes in elevation  07/31/2022: Recumbent bike Seat 5 for 4 minutes and Seat 4 minutes AAROM to full revolutions Tailgate knee flexion AROM 1 minute AAROM Seated knee flexion (Rt pushes Lt into flexion) 10X 10 seconds Quadriceps sets with heel prop 2 sets of 10 for 5 seconds Seated straight leg raises 3 sets of 5 with 1#  Vaso Lt knee Medium Pressure 10 minutes 34*  PATIENT EDUCATION:  07/21/2022 Education details: HEP update Person educated: Patient and Parent Education method: ECustomer service managerEducation comprehension: verbalized understanding and returned demonstration     HOME EXERCISE PROGRAM: Access Code: [email or web address]: [email or web address] Date: 07/21/2022 Prepared by: JKearney Hard Exercises - Supine Quad Set  - 3 x daily - 7 x weekly - 2 sets - 10 reps - 5 seconds hold - Supine Heel Slide with Strap  - 3 x daily - 7 x weekly - 10 reps - 5 seconds hold - Seated Long Arc Quad  - 3 x daily - 7 x weekly - 2 sets - 10 reps - 5 seconds hold - Seated Knee Flexion AAROM  - 3 x daily - 7 x weekly - 10 reps - 5 seconds hold - Sit to Stand  with Counter Support  - 3 x daily - 7 x weekly - 3 sets - 10 reps    ASSESSMENT:   CLINICAL IMPRESSION:  Pt to benefit from progressive strengthening and balance improvements to continue gains towards long term goals.  Noted improvement in symptoms reported today compared to previous weeks.    OBJECTIVE IMPAIRMENTS: decreased activity tolerance, difficulty walking, decreased balance, decreased endurance, decreased mobility, decreased ROM, decreased strength, impaired flexibility,  impaired LE use, postural dysfunction, and pain.   ACTIVITY LIMITATIONS: bending, lifting, carry, locomotion, cleaning, community activity, driving, and or occupation   PERSONAL FACTORS:  Anxiety, depression, Arthritis, HTN  are also affecting patient's functional outcome.   REHAB POTENTIAL: Good   CLINICAL DECISION MAKING: Stable/uncomplicated   EVALUATION COMPLEXITY: Low       GOALS: Short term PT Goals Target date: 08/11/2022 SHORT TERM GOALS: Target date: 08/13/2022  Will be compliant with appropriate progressive HEP  Baseline: Goal status: Met 08/11/2022     Will be able to complete bed mobility and functional transfers from standard height chair with no more than general  Baseline:  Goal status: Met 08/11/2022    Rt knee extension AROM to be no more than 5 degrees and flexion AROM to be no less than 115 degrees  Baseline:  Goal status: Met 08/11/2022    Pain in R knee to be no more than 4/10 at worst  Baseline:  Goal status: Met 08/11/2022     Long term PT goals Target date: 09/08/2022 Pt will improve ROM to [facility] to improve functional mobility Baseline: Goal status: on going 08/11/2022       2. MMT to improve by at least 1 grade in all weak groups       Baseline:       Goal status: MET 08/11/2022 Pt will improve FOTO to at least 63% functional to show improved function Baseline: Goal status: on going 08/11/2022 Pt will reduce pain by overall 50% overall with usual  activity Baseline: Goal status: on going 08/11/2022 Pt will reduce pain to overall less than 2-3/10 with usual activity and work activity. Baseline: Goal status: on going 08/11/2022 Pt will be able to ambulate community distances at least 1000 ft WNL gait pattern without complaints Baseline: Goal status: on going 08/11/2022   PLAN: PT FREQUENCY: 1-3 times per week    PT DURATION: 6-8 weeks   PLANNED INTERVENTIONS (unless contraindicated): aquatic PT, Canalith repositioning, cryotherapy, Electrical stimulation, Iontophoresis with 4 mg/ml dexamethasome, Moist heat, traction, Ultrasound, gait training, Therapeutic exercise, balance training, neuromuscular re-education, patient/family education, prosthetic training, manual techniques, passive ROM, dry needling, taping, vasopnuematic device, vestibular, spinal manipulations, joint manipulations   PLAN FOR NEXT SESSION:  Recheck strength, continue dynamic balance improvements.    Scot Jun, PT, DPT, OCS, ATC 08/11/22  12:04 PM

## 2022-08-14 ENCOUNTER — Other Ambulatory Visit: Payer: Self-pay | Admitting: Physician Assistant

## 2022-08-14 ENCOUNTER — Encounter: Payer: PPO | Admitting: Rehabilitative and Restorative Service Providers"

## 2022-08-18 ENCOUNTER — Encounter: Payer: PPO | Admitting: Rehabilitative and Restorative Service Providers"

## 2022-08-19 ENCOUNTER — Ambulatory Visit (INDEPENDENT_AMBULATORY_CARE_PROVIDER_SITE_OTHER): Payer: PPO | Admitting: Rehabilitative and Restorative Service Providers"

## 2022-08-19 ENCOUNTER — Encounter: Payer: Self-pay | Admitting: Rehabilitative and Restorative Service Providers"

## 2022-08-19 DIAGNOSIS — R600 Localized edema: Secondary | ICD-10-CM | POA: Diagnosis not present

## 2022-08-19 DIAGNOSIS — M25562 Pain in left knee: Secondary | ICD-10-CM | POA: Diagnosis not present

## 2022-08-19 DIAGNOSIS — M25662 Stiffness of left knee, not elsewhere classified: Secondary | ICD-10-CM

## 2022-08-19 DIAGNOSIS — M6281 Muscle weakness (generalized): Secondary | ICD-10-CM

## 2022-08-19 DIAGNOSIS — M5432 Sciatica, left side: Secondary | ICD-10-CM

## 2022-08-19 NOTE — Therapy (Signed)
OUTPATIENT PHYSICAL THERAPY TREATMENT NOTE/ PROGRESS NOTE   Patient Name: Kristy Henry MRN: [id number] DOB:[birth year], 71 y.o., female Today's Date: 08/19/2022  PCP: Kathyrn Lass, MD REFERRING PROVIDER: Leandrew Koyanagi, MD  Progress Note Reporting Period 07/14/2022 to 08/19/2022  See note below for Objective Data and Assessment of Progress/Goals.   END OF SESSION:   PT End of Session - 08/19/22 1303     Visit Number 9    Number of Visits 16    Authorization Type [employer]    PT Start Time 1300    PT Stop Time 1340    PT Time Calculation (min) 40 min    Activity Tolerance Patient tolerated treatment well    Behavior During Therapy WFL for tasks assessed/performed                 Past Medical History:  Diagnosis Date   Anxiety    Arthritis    Depression    High cholesterol    HLD (hyperlipidemia)    Hypertension    Pneumonia    Primary sleep disturbance    Syncope and collapse 2019   Vision abnormalities    Past Surgical History:  Procedure Laterality Date   ABDOMINAL HYSTERECTOMY     BONE EXCISION     from bilateral hands   BREAST BIOPSY Right 09/25/2016   eye lid surgery     RECTOCELE REPAIR     TOTAL KNEE ARTHROPLASTY Left 06/29/2022   Procedure: LEFT TOTAL KNEE ARTHROPLASTY;  Surgeon: Leandrew Koyanagi, MD;  Location: [place];  Service: Orthopedics;  Laterality: Left;   Patient Active Problem List   Diagnosis Date Noted   Status post total left knee replacement 06/29/2022   Primary osteoarthritis of left knee 06/03/2022   Non-restorative sleep 12/12/2018   Nocturia more than twice per night 12/12/2018   Snorings 12/12/2018   Menopausal hot flushes 12/12/2018   Excessive daytime sleepiness 12/12/2018    REFERRING DIAG: Status post total left knee replacement [Z96.652], Primary osteoarthritis of left knee [M17.12]  THERAPY DIAG:  Stiffness of left knee, not elsewhere classified  Acute pain of left knee  Localized  edema  Muscle weakness (generalized)  Sciatica, left side  Rationale for Evaluation and Treatment Rehabilitation  PERTINENT HISTORY: Anxiety, depression, Arthritis, HTN   PRECAUTIONS: none  SUBJECTIVE:   Pt indicated feeling much better this week.  Pt indicated she missed last visit because she stopped taking prescription medicine and was "a bit hungover" for a few days.    PAIN:  NPRS scale:  0/10 upon arrival.  Pain location: left knee Pain description:  Aggravating factors: bending Relieving factors: ice, heat, pain meds   OBJECTIVE: (objective measures completed at initial evaluation unless otherwise dated)    DIAGNOSTIC FINDINGS:   07/14/2022 review: Left knee arthroplasty without immediate postoperative complication.   PATIENT SURVEYS:  08/19/2022 FOTO: 65  07/14/2022 FOTO 52.48%, 63% in 16 visits   COGNITION: 07/14/2022           Overall cognitive status: Within functional limits for tasks assessed                          SENSATION: 07/14/2022 [facility]   EDEMA:  07/21/22: Circumferential: Left 42 centimeters                        Right 35 centimeters   07/14/22: Circumferential: Rt:49"  Lt: 35"   POSTURE:  07/14/2022 rounded shoulders and forward head   PALPATION: 07/14/2022 Tenderness surrounding surgical site.    LOWER EXTREMITY ROM:   Passive ROM Right 07/14/2022 Left 07/14/2022 Left 07/24/2022 Left 07/31/2022 Left 08/19/2022  Knee flexion 128 80/90 98  AROM 112 AROM heel slide 125 supine  Knee extension 0 -10 (lacking)   AROM -3 -2 AROM in seated LAQ   (Blank rows = not tested)   LOWER EXTREMITY MMT:   MMT Right 07/21/22 Left 07/21/22 Lt/Rt in pounds 07/31/2022 Left 08/07/2022 Left 08/19/2022  Knee flexion  5/5 3/5    5/5  Knee extension  5/5 3/5  18.5/64.5 29.1, 31.1 lbs  5/5 34.5, 33.5 lbs   (Blank rows = not tested)     GAIT: 08/04/2022:  Independent ambulation within clinic.   07/14/2022 Distance walked: 10f  Assistive device  utilized: Single point cane Level of assistance: Complete Independence Comments: Pt walks with decreased stance time on L LE.      TODAY'S TREATMENT: 08/19/2022: Therex: Recumbent bike lvl 3 10 mins  Step up over and down 6 inch WB on Lt LE x 15  Leg extension machine double leg up, single leg Lt lowering slowly 10 lbs 2 x 10  Leg press Single leg 2 x 15 62 lbs slow movement control focus Stair navigation down reciprocal gait pattern bilateral UE assist full flight   Scar cross friction massage cues for home use.    Neuro Re-ed FKeySpanfwd/back light touching focus x 25 c SBA SLS on black mat with corner touching x 8 each , performed bilaterally    08/11/2022 Therex Recumbent bike lvl 2 8 mins seat 6 Lateral step down 4 inch slow 2 x 10 WB on Lt Knee extension machine double leg up, Lt leg lowering 5 lbs 2 x 10 slow lowering focus   Neuro Re-ed Tandem ambulation fwd/back on foam 6 ft x 10 each way c occasional hand assist SLS c contralateral leg reaching light touch fwd, lateral and back x 10 on Lt leg  08/07/2022 Therex: Recumbent bike Lvl 2 8 mins, seat 4 Incline Calf stretch 30 sec x 3 bilateral (heels on ground) Leg press single leg 37 lbs 2 x 15, performed bilateral Step up Lt leg 6 inch x 15 c slow lowering focus Sit to stand to sit 20 inch table x 10 c slow lowering  Vasopneumatic Medium compression, 34 degrees, x 10 minutes in elevation   PATIENT EDUCATION:  07/21/2022 Education details: HEP update Person educated: Patient and Parent Education method: ECustomer service managerEducation comprehension: verbalized understanding and returned demonstration     HOME EXERCISE PROGRAM: Access Code: [email or web address]: [email or web address] Date: 07/21/2022 Prepared by: JKearney Hard Exercises - Supine Quad Set  - 3 x daily - 7 x weekly - 2 sets - 10 reps - 5 seconds hold - Supine Heel Slide with Strap  - 3 x daily - 7 x weekly - 10  reps - 5 seconds hold - Seated Long Arc Quad  - 3 x daily - 7 x weekly - 2 sets - 10 reps - 5 seconds hold - Seated Knee Flexion AAROM  - 3 x daily - 7 x weekly - 10 reps - 5 seconds hold - Sit to Stand with Counter Support  - 3 x daily - 7 x weekly - 3 sets - 10 reps    ASSESSMENT:   CLINICAL IMPRESSION:  Pt has attended 9 visits overall during course of treatment.  See objective  data for updated information.  Improvements noted in strength, motion and movement coordination as well as FOTO reassessment.    OBJECTIVE IMPAIRMENTS: decreased activity tolerance, difficulty walking, decreased balance, decreased endurance, decreased mobility, decreased ROM, decreased strength, impaired flexibility, impaired LE use, postural dysfunction, and pain.   ACTIVITY LIMITATIONS: bending, lifting, carry, locomotion, cleaning, community activity, driving, and or occupation   PERSONAL FACTORS:  Anxiety, depression, Arthritis, HTN  are also affecting patient's functional outcome.   REHAB POTENTIAL: Good   CLINICAL DECISION MAKING: Stable/uncomplicated   EVALUATION COMPLEXITY: Low       GOALS: Short term PT Goals Target date: 08/11/2022 SHORT TERM GOALS: Target date: 08/13/2022  Will be compliant with appropriate progressive HEP  Baseline: Goal status: Met 08/11/2022     Will be able to complete bed mobility and functional transfers from standard height chair with no more than general  Baseline:  Goal status: Met 08/11/2022    Rt knee extension AROM to be no more than 5 degrees and flexion AROM to be no less than 115 degrees  Baseline:  Goal status: Met 08/11/2022    Pain in R knee to be no more than 4/10 at worst  Baseline:  Goal status: Met 08/11/2022     Long term PT goals Target date: 09/08/2022 Pt will improve ROM to [facility] to improve functional mobility Baseline: Goal status: Met 08/19/2022       2. MMT to improve by at least 1 grade in all weak groups       Baseline:       Goal  status: MET 08/11/2022 Pt will improve FOTO to at least 63% functional to show improved function Baseline: Goal status: Met 08/19/2022 Pt will reduce pain by overall 50% overall with usual activity Baseline: Goal status: Met 08/19/2022 Pt will reduce pain to overall less than 2-3/10 with usual activity and work activity. Baseline: Goal status: On going  08/19/2022 Pt will be able to ambulate community distances at least 1000 ft WNL gait pattern without complaints Baseline: Goal status: Met 08/19/2022   PLAN: PT FREQUENCY: 1-3 times per week    PT DURATION: 6-8 weeks   PLANNED INTERVENTIONS (unless contraindicated): aquatic PT, Canalith repositioning, cryotherapy, Electrical stimulation, Iontophoresis with 4 mg/ml dexamethasome, Moist heat, traction, Ultrasound, gait training, Therapeutic exercise, balance training, neuromuscular re-education, patient/family education, prosthetic training, manual techniques, passive ROM, dry needling, taping, vasopnuematic device, vestibular, spinal manipulations, joint manipulations   PLAN FOR NEXT SESSION:  Continue compliant /dynamic balance, progressive strengthening.     Scot Jun, PT, DPT, OCS, ATC 08/19/22  1:51 PM

## 2022-08-21 ENCOUNTER — Encounter: Payer: PPO | Admitting: Rehabilitative and Restorative Service Providers"

## 2022-08-21 DIAGNOSIS — Z23 Encounter for immunization: Secondary | ICD-10-CM | POA: Diagnosis not present

## 2022-08-25 ENCOUNTER — Encounter: Payer: Self-pay | Admitting: Rehabilitative and Restorative Service Providers"

## 2022-08-25 ENCOUNTER — Ambulatory Visit (INDEPENDENT_AMBULATORY_CARE_PROVIDER_SITE_OTHER): Payer: PPO | Admitting: Rehabilitative and Restorative Service Providers"

## 2022-08-25 DIAGNOSIS — M6281 Muscle weakness (generalized): Secondary | ICD-10-CM

## 2022-08-25 DIAGNOSIS — R600 Localized edema: Secondary | ICD-10-CM | POA: Diagnosis not present

## 2022-08-25 DIAGNOSIS — M25562 Pain in left knee: Secondary | ICD-10-CM | POA: Diagnosis not present

## 2022-08-25 DIAGNOSIS — M25662 Stiffness of left knee, not elsewhere classified: Secondary | ICD-10-CM | POA: Diagnosis not present

## 2022-08-25 NOTE — Therapy (Signed)
OUTPATIENT PHYSICAL THERAPY TREATMENT NOTE   Patient Name: Kristy Henry MRN: [id number] DOB:April 08, 1951, 71 y.o., female Today's Date: 08/25/2022  PCP: Kathyrn Lass, MD REFERRING PROVIDER: Leandrew Koyanagi, MD   END OF SESSION:   PT End of Session - 08/25/22 1303     Visit Number 10    Number of Visits 16    Authorization Type [employer]    Progress Note Due on Visit 16    PT Start Time 5456    PT Stop Time 1338    PT Time Calculation (min) 40 min    Activity Tolerance Patient tolerated treatment well    Behavior During Therapy WFL for tasks assessed/performed                  Past Medical History:  Diagnosis Date   Anxiety    Arthritis    Depression    High cholesterol    HLD (hyperlipidemia)    Hypertension    Pneumonia    Primary sleep disturbance    Syncope and collapse 2019   Vision abnormalities    Past Surgical History:  Procedure Laterality Date   ABDOMINAL HYSTERECTOMY     BONE EXCISION     from bilateral hands   BREAST BIOPSY Right 09/25/2016   eye lid surgery     RECTOCELE REPAIR     TOTAL KNEE ARTHROPLASTY Left 06/29/2022   Procedure: LEFT TOTAL KNEE ARTHROPLASTY;  Surgeon: Leandrew Koyanagi, MD;  Location: [place];  Service: Orthopedics;  Laterality: Left;   Patient Active Problem List   Diagnosis Date Noted   Status post total left knee replacement 06/29/2022   Primary osteoarthritis of left knee 06/03/2022   Non-restorative sleep 12/12/2018   Nocturia more than twice per night 12/12/2018   Snorings 12/12/2018   Menopausal hot flushes 12/12/2018   Excessive daytime sleepiness 12/12/2018    REFERRING DIAG: Status post total left knee replacement [Z96.652], Primary osteoarthritis of left knee [M17.12]  THERAPY DIAG:  Stiffness of left knee, not elsewhere classified  Acute pain of left knee  Localized edema  Muscle weakness (generalized)  Rationale for Evaluation and Treatment Rehabilitation  PERTINENT HISTORY:  Anxiety, depression, Arthritis, HTN   PRECAUTIONS: none  SUBJECTIVE:   Pt indicated feeling like she was able to walk better.  Did have some swelling after walking.  Pt. Indicated hips were sore after walking.    PAIN:  NPRS scale:  0/10 upon arrival.  Pain location: left knee Pain description:  Aggravating factors: bending Relieving factors: ice, heat, pain meds   OBJECTIVE: (objective measures completed at initial evaluation unless otherwise dated)    DIAGNOSTIC FINDINGS:   07/14/2022 review: Left knee arthroplasty without immediate postoperative complication.   PATIENT SURVEYS:  08/19/2022 FOTO: 65  07/14/2022 FOTO 52.48%, 63% in 16 visits   COGNITION: 07/14/2022           Overall cognitive status: Within functional limits for tasks assessed                          SENSATION: 07/14/2022 [facility]   EDEMA:  07/21/22: Circumferential: Left 42 centimeters                        Right 35 centimeters   07/14/22: Circumferential: Rt:49"  Lt: 35"   POSTURE:  07/14/2022 rounded shoulders and forward head   PALPATION: 07/14/2022 Tenderness surrounding surgical site.    LOWER  EXTREMITY ROM:   Passive ROM Right 07/14/2022 Left 07/14/2022 Left 07/24/2022 Left 07/31/2022 Left 08/19/2022  Knee flexion 128 80/90 98  AROM 112 AROM heel slide 125 supine  Knee extension 0 -10 (lacking)   AROM -3 -2 AROM in seated LAQ   (Blank rows = not tested)   LOWER EXTREMITY MMT:   MMT Right 07/21/22 Left 07/21/22 Lt/Rt in pounds 07/31/2022 Left 08/07/2022 Left 08/19/2022  Knee flexion  5/5 3/5    5/5  Knee extension  5/5 3/5  18.5/64.5 29.1, 31.1 lbs  5/5 34.5, 33.5 lbs   (Blank rows = not tested)     GAIT: 08/04/2022:  Independent ambulation within clinic.   07/14/2022 Distance walked: 35f  Assistive device utilized: Single point cane Level of assistance: Complete Independence Comments: Pt walks with decreased stance time on L LE.      TODAY'S  TREATMENT: 08/25/2022 Therex: Recumbent bike lvl 3 10 mins  Step up 8 inch x 10 hand on rail  Leg press Single leg 2 x 15 62 lbs slow movement control focus Leg extension machine double leg up, single leg Lt lowering slowly 10 lbs 2 x 12 Supine bridge x 15 Supine clam shell blue band x 20 bilateral    08/19/2022: Therex: Recumbent bike lvl 3 10 mins  Step up over and down 6 inch WB on Lt LE x 15  Leg extension machine double leg up, single leg Lt lowering slowly 10 lbs 2 x 10  Leg press Single leg 2 x 15 62 lbs slow movement control focus Stair navigation down reciprocal gait pattern bilateral UE assist full flight   Scar cross friction massage cues for home use.    Neuro Re-ed FKeySpanfwd/back light touching focus x 25 c SBA SLS on black mat with corner touching x 8 each , performed bilaterally    08/11/2022 Therex Recumbent bike lvl 2 8 mins seat 6 Lateral step down 4 inch slow 2 x 10 WB on Lt Knee extension machine double leg up, Lt leg lowering 5 lbs 2 x 10 slow lowering focus   Neuro Re-ed Tandem ambulation fwd/back on foam 6 ft x 10 each way c occasional hand assist SLS c contralateral leg reaching light touch fwd, lateral and back x 10 on Lt leg  08/07/2022 Therex: Recumbent bike Lvl 2 8 mins, seat 4 Incline Calf stretch 30 sec x 3 bilateral (heels on ground) Leg press single leg 37 lbs 2 x 15, performed bilateral Step up Lt leg 6 inch x 15 c slow lowering focus Sit to stand to sit 20 inch table x 10 c slow lowering  Vasopneumatic Medium compression, 34 degrees, x 10 minutes in elevation   PATIENT EDUCATION:  07/21/2022 Education details: HEP update Person educated: Patient and Parent Education method: ECustomer service managerEducation comprehension: verbalized understanding and returned demonstration     HOME EXERCISE PROGRAM: Access Code: [email or web address]: [email or web address] Date: 08/25/2022 Prepared by: MScot Jun Exercises - Supine Quad Set  - 3 x daily - 7 x weekly - 2 sets - 10 reps - 5 seconds hold - Supine Heel Slide with Strap  - 3 x daily - 7 x weekly - 10 reps - 5 seconds hold - Seated Long Arc Quad  - 3 x daily - 7 x weekly - 2 sets - 10 reps - 5 seconds hold - Seated Knee Flexion AAROM  - 3 x daily - 7 x weekly - 10 reps - 5 seconds  hold - Seated Straight Leg Heel Taps  - 1-2 x daily - 7 x weekly - 3 sets - 10 reps - Supine Bridge  - 1-2 x daily - 7 x weekly - 1-2 sets - 10 reps - 2 hold - Hooklying Isometric Clamshell  - 1-2 x daily - 7 x weekly - 1-2 sets - 10 reps    ASSESSMENT:   CLINICAL IMPRESSION:  Continued improvement in reports of ability to perform daily activity including longer distance walking.  Lateral/posterior hip instability possible factor in stair navigation and general soreness what was reported.  Performed hip strengthening for addition into HEP.    OBJECTIVE IMPAIRMENTS: decreased activity tolerance, difficulty walking, decreased balance, decreased endurance, decreased mobility, decreased ROM, decreased strength, impaired flexibility, impaired LE use, postural dysfunction, and pain.   ACTIVITY LIMITATIONS: bending, lifting, carry, locomotion, cleaning, community activity, driving, and or occupation   PERSONAL FACTORS:  Anxiety, depression, Arthritis, HTN  are also affecting patient's functional outcome.   REHAB POTENTIAL: Good   CLINICAL DECISION MAKING: Stable/uncomplicated   EVALUATION COMPLEXITY: Low       GOALS: Short term PT Goals Target date: 08/11/2022 SHORT TERM GOALS: Target date: 08/13/2022  Will be compliant with appropriate progressive HEP  Baseline: Goal status: Met 08/11/2022     Will be able to complete bed mobility and functional transfers from standard height chair with no more than general  Baseline:  Goal status: Met 08/11/2022    Rt knee extension AROM to be no more than 5 degrees and flexion AROM to be no less than 115  degrees  Baseline:  Goal status: Met 08/11/2022    Pain in R knee to be no more than 4/10 at worst  Baseline:  Goal status: Met 08/11/2022     Long term PT goals Target date: 09/08/2022 Pt will improve ROM to [facility] to improve functional mobility Baseline: Goal status: Met 08/19/2022       2. MMT to improve by at least 1 grade in all weak groups       Baseline:       Goal status: MET 08/11/2022 Pt will improve FOTO to at least 63% functional to show improved function Baseline: Goal status: Met 08/19/2022 Pt will reduce pain by overall 50% overall with usual activity Baseline: Goal status: Met 08/19/2022 Pt will reduce pain to overall less than 2-3/10 with usual activity and work activity. Baseline: Goal status: On going  08/19/2022 Pt will be able to ambulate community distances at least 1000 ft WNL gait pattern without complaints Baseline: Goal status: Met 08/19/2022   PLAN: PT FREQUENCY: 1-3 times per week    PT DURATION: 6-8 weeks   PLANNED INTERVENTIONS (unless contraindicated): aquatic PT, Canalith repositioning, cryotherapy, Electrical stimulation, Iontophoresis with 4 mg/ml dexamethasome, Moist heat, traction, Ultrasound, gait training, Therapeutic exercise, balance training, neuromuscular re-education, patient/family education, prosthetic training, manual techniques, passive ROM, dry needling, taping, vasopnuematic device, vestibular, spinal manipulations, joint manipulations   PLAN FOR NEXT SESSION: Assess for plan going forward.    Scot Jun, PT, DPT, OCS, ATC 08/25/22  1:42 PM

## 2022-08-28 ENCOUNTER — Other Ambulatory Visit: Payer: Self-pay | Admitting: Physician Assistant

## 2022-08-28 ENCOUNTER — Encounter: Payer: PPO | Admitting: Rehabilitative and Restorative Service Providers"

## 2022-08-28 MED ORDER — GABAPENTIN 300 MG PO CAPS: 300.0000 mg | ORAL_CAPSULE | Freq: Three times a day (TID) | ORAL | 2 refills | Status: DC | PRN

## 2022-08-28 NOTE — Telephone Encounter (Signed)
Patient is completely out of medication

## 2022-08-28 NOTE — Telephone Encounter (Signed)
Patient called in stating she needs a refill on Gabapentin, she is stating she needs to take them every 8 hours instead of once a day and also she is completely off the Hydromorphone and it is no longer needed.

## 2022-08-28 NOTE — Telephone Encounter (Signed)
I read the message thread and it looks like she just needed gabapentin refilled for tid so I sent in

## 2022-08-31 ENCOUNTER — Ambulatory Visit (INDEPENDENT_AMBULATORY_CARE_PROVIDER_SITE_OTHER): Payer: PPO | Admitting: Rehabilitative and Restorative Service Providers"

## 2022-08-31 ENCOUNTER — Encounter: Payer: Self-pay | Admitting: Rehabilitative and Restorative Service Providers"

## 2022-08-31 DIAGNOSIS — M5432 Sciatica, left side: Secondary | ICD-10-CM | POA: Diagnosis not present

## 2022-08-31 DIAGNOSIS — M25562 Pain in left knee: Secondary | ICD-10-CM

## 2022-08-31 DIAGNOSIS — M6281 Muscle weakness (generalized): Secondary | ICD-10-CM | POA: Diagnosis not present

## 2022-08-31 DIAGNOSIS — R600 Localized edema: Secondary | ICD-10-CM

## 2022-08-31 DIAGNOSIS — M25662 Stiffness of left knee, not elsewhere classified: Secondary | ICD-10-CM

## 2022-08-31 NOTE — Therapy (Signed)
OUTPATIENT PHYSICAL THERAPY TREATMENT NOTE [place]   Patient Name: Kristy Henry MRN: [id number] DOB:02-23-1951, 71 y.o., female Today's Date: 08/31/2022  PCP: Kathyrn Lass, MD REFERRING PROVIDER: Leandrew Koyanagi, MD     END OF SESSION:   PT End of Session - 08/31/22 1032     Visit Number 11    Number of Visits 16    Authorization Type [employer]    Progress Note Due on Visit 16    PT Start Time 1021    PT Stop Time 1053    PT Time Calculation (min) 32 min    Activity Tolerance Patient tolerated treatment well    Behavior During Therapy WFL for tasks assessed/performed                   Past Medical History:  Diagnosis Date   Anxiety    Arthritis    Depression    High cholesterol    HLD (hyperlipidemia)    Hypertension    Pneumonia    Primary sleep disturbance    Syncope and collapse 2019   Vision abnormalities    Past Surgical History:  Procedure Laterality Date   ABDOMINAL HYSTERECTOMY     BONE EXCISION     from bilateral hands   BREAST BIOPSY Right 09/25/2016   eye lid surgery     RECTOCELE REPAIR     TOTAL KNEE ARTHROPLASTY Left 06/29/2022   Procedure: LEFT TOTAL KNEE ARTHROPLASTY;  Surgeon: Leandrew Koyanagi, MD;  Location: [place];  Service: Orthopedics;  Laterality: Left;   Patient Active Problem List   Diagnosis Date Noted   Status post total left knee replacement 06/29/2022   Primary osteoarthritis of left knee 06/03/2022   Non-restorative sleep 12/12/2018   Nocturia more than twice per night 12/12/2018   Snorings 12/12/2018   Menopausal hot flushes 12/12/2018   Excessive daytime sleepiness 12/12/2018    REFERRING DIAG: Status post total left knee replacement [Z96.652], Primary osteoarthritis of left knee [M17.12]  THERAPY DIAG:  Stiffness of left knee, not elsewhere classified  Acute pain of left knee  Localized edema  Muscle weakness (generalized)  Sciatica, left side  Rationale for Evaluation and  Treatment Rehabilitation  PERTINENT HISTORY: Anxiety, depression, Arthritis, HTN   PRECAUTIONS: none  SUBJECTIVE:   Pt indicated her knee has been doing well.  She reported she did feel like the blood pressure medicine change hasn't been good.  Pt indicated some OTC medicine at times for Lt knee at times.  Pt indicated she has been able to walk in neighborhood and at some park places.   Pt indicated having readout of high blood pressures (180s for top number).  Wanted to take it today.   BP Measured in middle of session:   151/90 in sitting  PAIN:  NPRS scale:  at worst 3/10 Pain location: left knee Pain description:  Aggravating factors: bending Relieving factors: ice, heat, pain meds   OBJECTIVE: (objective measures completed at initial evaluation unless otherwise dated)    DIAGNOSTIC FINDINGS:   07/14/2022 review: Left knee arthroplasty without immediate postoperative complication.   PATIENT SURVEYS:  08/31/2022 71  08/19/2022 FOTO: 65  07/14/2022 FOTO 52.48%, 63% in 16 visits   COGNITION: 07/14/2022           Overall cognitive status: Within functional limits for tasks assessed  SENSATION: 07/14/2022 WFL   EDEMA:  07/21/22: Circumferential: Left 42 centimeters                        Right 35 centimeters   07/14/22: Circumferential: Rt:49"  Lt: 35"   POSTURE:  07/14/2022 rounded shoulders and forward head   PALPATION: 07/14/2022 Tenderness surrounding surgical site.    LOWER EXTREMITY ROM:   Passive ROM Right 07/14/2022 Left 07/14/2022 Left 07/24/2022 Left 07/31/2022 Left 08/19/2022  Knee flexion 128 80/90 98  AROM 112 AROM heel slide 125 supine  Knee extension 0 -10 (lacking)   AROM -3 -2 AROM in seated LAQ   (Blank rows = not tested)   LOWER EXTREMITY MMT:   MMT Right 07/21/22 Left 07/21/22 Lt/Rt in pounds 07/31/2022 Left 08/07/2022 Left 08/19/2022 Left 08/31/2022  Knee flexion  5/5 3/5    5/5   Knee extension  5/5 3/5   18.5/64.5 29.1, 31.1 lbs  5/5 34.5, 33.5 lbs 5/5 41.1, 38 lbs   (Blank rows = not tested)     GAIT: 08/04/2022:  Independent ambulation within clinic.   07/14/2022 Distance walked: 27f  Assistive device utilized: Single point cane Level of assistance: Complete Independence Comments: Pt walks with decreased stance time on L LE.      TODAY'S TREATMENT: 08/31/2022 Therex: UBE LE only lvl 3.0 10 mins Seated quad set 5 sec hold x 2 Lt leg Seated SLR x 10 Lt   Review of HEP c handout.  Verbal and visual review c cues for techniques and consistency of use.  Detailed through gym based workout options (leg press, extension, bike)    08/25/2022 Therex: Recumbent bike lvl 3 10 mins  Step up 8 inch x 10 hand on rail  Leg press Single leg 2 x 15 62 lbs slow movement control focus Leg extension machine double leg up, single leg Lt lowering slowly 10 lbs 2 x 12 Supine bridge x 15 Supine clam shell blue band x 20 bilateral    08/19/2022: Therex: Recumbent bike lvl 3 10 mins  Step up over and down 6 inch WB on Lt LE x 15  Leg extension machine double leg up, single leg Lt lowering slowly 10 lbs 2 x 10  Leg press Single leg 2 x 15 62 lbs slow movement control focus Stair navigation down reciprocal gait pattern bilateral UE assist full flight   Scar cross friction massage cues for home use.    Neuro Re-ed FKeySpanfwd/back light touching focus x 25 c SBA SLS on black mat with corner touching x 8 each , performed bilaterally   PATIENT EDUCATION:  08/31/2022 Education details: HEP update for discharge Person educated: Patient and Parent Education method: ECustomer service managerEducation comprehension: verbalized understanding and returned demonstration     HOME EXERCISE PROGRAM: Access Code: [email or web address]: [email or web address] Date: 08/31/2022 Prepared by: MScot Jun Exercises - Seated Long Arc Quad (Mirrored)  - 3 x daily - 7 x weekly - 2  sets - 10 reps - 5 seconds hold - Seated Knee Flexion AAROM (Mirrored)  - 3 x daily - 7 x weekly - 10 reps - 5 seconds hold - Seated Quad Set (Mirrored)  - 3-5 x daily - 7 x weekly - 1 sets - 10 reps - 5 hold - Seated Straight Leg Heel Taps  - 1-2 x daily - 7 x weekly - 3 sets - 10 reps - Supine Bridge  - 1-2 x daily -  7 x weekly - 1-2 sets - 10 reps - 2 hold - Hooklying Isometric Clamshell  - 1-2 x daily - 7 x weekly - 1-2 sets - 10 reps - Sit to Stand  - 3 x daily - 7 x weekly - 1 sets - 10 reps    ASSESSMENT:   CLINICAL IMPRESSION:  Pt has attended 11 visits overall.  Progress noted in objective measurements as noted.  Most goals have been met at this time.  Reviewed HEP for discharge to HEP at this time.  Pt. In agreement with plan at this time.     OBJECTIVE IMPAIRMENTS: decreased activity tolerance, difficulty walking, decreased balance, decreased endurance, decreased mobility, decreased ROM, decreased strength, impaired flexibility, impaired LE use, postural dysfunction, and pain.   ACTIVITY LIMITATIONS: bending, lifting, carry, locomotion, cleaning, community activity, driving, and or occupation   PERSONAL FACTORS:  Anxiety, depression, Arthritis, HTN  are also affecting patient's functional outcome.   REHAB POTENTIAL: Good   CLINICAL DECISION MAKING: Stable/uncomplicated   EVALUATION COMPLEXITY: Low       GOALS: Short term PT Goals Target date: 08/11/2022 SHORT TERM GOALS: Target date: 08/13/2022  Will be compliant with appropriate progressive HEP  Baseline: Goal status: Met 08/11/2022     Will be able to complete bed mobility and functional transfers from standard height chair with no more than general  Baseline:  Goal status: Met 08/11/2022    Rt knee extension AROM to be no more than 5 degrees and flexion AROM to be no less than 115 degrees  Baseline:  Goal status: Met 08/11/2022    Pain in R knee to be no more than 4/10 at worst  Baseline:  Goal status: Met  08/11/2022     Long term PT goals Target date: 09/08/2022 Pt will improve ROM to [facility] to improve functional mobility Baseline: Goal status: Met 08/19/2022       2. MMT to improve by at least 1 grade in all weak groups       Baseline:       Goal status: MET 08/11/2022 Pt will improve FOTO to at least 63% functional to show improved function Baseline: Goal status: Met 08/19/2022 Pt will reduce pain by overall 50% overall with usual activity Baseline: Goal status: Met 08/19/2022 Pt will reduce pain to overall less than 2-3/10 with usual activity and work activity. Baseline: Goal status: Met 08/31/2022 Pt will be able to ambulate community distances at least 1000 ft WNL gait pattern without complaints Baseline: Goal status: Met 08/19/2022   PLAN: PT FREQUENCY: 1-3 times per week    PT DURATION: 6-8 weeks   PLANNED INTERVENTIONS (unless contraindicated): aquatic PT, Canalith repositioning, cryotherapy, Electrical stimulation, Iontophoresis with 4 mg/ml dexamethasome, Moist heat, traction, Ultrasound, gait training, Therapeutic exercise, balance training, neuromuscular re-education, patient/family education, prosthetic training, manual techniques, passive ROM, dry needling, taping, vasopnuematic device, vestibular, spinal manipulations, joint manipulations   PLAN FOR NEXT SESSION: Discharge to HEP.   Scot Jun, PT, DPT, OCS, ATC 08/31/22  10:58 AM

## 2022-09-01 ENCOUNTER — Telehealth: Payer: Self-pay | Admitting: Orthopaedic Surgery

## 2022-09-01 MED ORDER — GABAPENTIN 300 MG PO CAPS: 300.0000 mg | ORAL_CAPSULE | Freq: Three times a day (TID) | ORAL | 2 refills | Status: DC | PRN

## 2022-09-01 NOTE — Telephone Encounter (Signed)
done

## 2022-09-01 NOTE — Addendum Note (Signed)
Addended by: Azucena Cecil on: 09/01/2022 04:55 PM   Modules accepted: Orders

## 2022-09-01 NOTE — Telephone Encounter (Signed)
Called and notified patient.

## 2022-09-01 NOTE — Telephone Encounter (Signed)
Pt called requesting a refill of gabapentin. Pt states she was taking every 8 hrs and ran out. Please call pt about this matter at [phone]. Pt states she is going out of town for the holiday and need her medication.

## 2022-09-10 ENCOUNTER — Ambulatory Visit (HOSPITAL_BASED_OUTPATIENT_CLINIC_OR_DEPARTMENT_OTHER): Payer: PPO | Admitting: Family Medicine

## 2022-09-15 ENCOUNTER — Ambulatory Visit (INDEPENDENT_AMBULATORY_CARE_PROVIDER_SITE_OTHER): Payer: PPO | Admitting: Physician Assistant

## 2022-09-15 ENCOUNTER — Encounter: Payer: Self-pay | Admitting: Orthopaedic Surgery

## 2022-09-15 DIAGNOSIS — Z96652 Presence of left artificial knee joint: Secondary | ICD-10-CM

## 2022-09-15 NOTE — Progress Notes (Signed)
   Post-Op Visit Note   Patient: Kristy Henry           Date of Birth: Apr 07, 1951           MRN: [id number] Visit Date: 09/15/2022 PCP: Kathyrn Lass, MD   Assessment & Plan:  Chief Complaint:  Chief Complaint  Patient presents with   Left Knee - Follow-up    Left total knee arthroplasty 06/29/2022   Visit Diagnoses:  1. Status post total left knee replacement     Plan: Patient is a pleasant 71 year old female who comes in today approximately 2-1/2 months status post left total knee replacement 06/29/2022.  She has been doing great.  She finished physical therapy last week and is walking unassisted.  She still notes slight paresthesias to the lateral knee.  Examination of the left knee reveals range of motion from 0 to 110 degrees.  She is stable valgus varus stress.  She is neurovascular intact distally.  At this point, she will continue to advance with activity as tolerated.  Dental prophylaxis reinforced.  Follow-up in 3 months for repeat evaluation and 2 view x-rays of the left knee.  Call with concerns or questions.  Follow-Up Instructions: Return in about 3 months (around 12/15/2022).   Orders:  No orders of the defined types were placed in this encounter.  No orders of the defined types were placed in this encounter.   Imaging: No new imaging  PMFS History: Patient Active Problem List   Diagnosis Date Noted   Status post total left knee replacement 06/29/2022   Primary osteoarthritis of left knee 06/03/2022   Non-restorative sleep 12/12/2018   Nocturia more than twice per night 12/12/2018   Snorings 12/12/2018   Menopausal hot flushes 12/12/2018   Excessive daytime sleepiness 12/12/2018   Past Medical History:  Diagnosis Date   Anxiety    Arthritis    Depression    High cholesterol    HLD (hyperlipidemia)    Hypertension    Pneumonia    Primary sleep disturbance    Syncope and collapse 2019   Vision abnormalities     Family History  Problem Relation  Age of Onset   Heart disease Mother    Cancer Father    Colon cancer Father    Breast cancer Sister 83   High blood pressure Sister    Diabetes type II Brother    Diabetes type II Brother     Past Surgical History:  Procedure Laterality Date   ABDOMINAL HYSTERECTOMY     BONE EXCISION     from bilateral hands   BREAST BIOPSY Right 09/25/2016   eye lid surgery     RECTOCELE REPAIR     TOTAL KNEE ARTHROPLASTY Left 06/29/2022   Procedure: LEFT TOTAL KNEE ARTHROPLASTY;  Surgeon: Leandrew Koyanagi, MD;  Location: [place];  Service: Orthopedics;  Laterality: Left;   Social History   Occupational History   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

## 2022-09-16 DIAGNOSIS — I10 Essential (primary) hypertension: Secondary | ICD-10-CM | POA: Diagnosis not present

## 2022-09-16 DIAGNOSIS — E559 Vitamin D deficiency, unspecified: Secondary | ICD-10-CM | POA: Diagnosis not present

## 2022-09-16 DIAGNOSIS — R61 Generalized hyperhidrosis: Secondary | ICD-10-CM | POA: Diagnosis not present

## 2022-10-12 DIAGNOSIS — N390 Urinary tract infection, site not specified: Secondary | ICD-10-CM | POA: Diagnosis not present

## 2022-10-20 ENCOUNTER — Other Ambulatory Visit: Payer: Self-pay | Admitting: Physician Assistant

## 2022-10-20 ENCOUNTER — Telehealth: Payer: Self-pay | Admitting: Orthopaedic Surgery

## 2022-10-20 MED ORDER — AMOXICILLIN 500 MG PO CAPS: ORAL_CAPSULE | ORAL | 2 refills | Status: DC

## 2022-10-20 NOTE — Telephone Encounter (Signed)
sent 

## 2022-10-20 NOTE — Telephone Encounter (Signed)
Patient called advised she is having dental work 11/03/2022 and would like for the antibiotic to be sent to her pharmacy. [facility] on [address].    The number to contact patient is [phone]

## 2022-10-21 DIAGNOSIS — J0141 Acute recurrent pansinusitis: Secondary | ICD-10-CM | POA: Diagnosis not present

## 2022-10-30 DIAGNOSIS — I10 Essential (primary) hypertension: Secondary | ICD-10-CM | POA: Diagnosis not present

## 2022-10-30 DIAGNOSIS — N390 Urinary tract infection, site not specified: Secondary | ICD-10-CM | POA: Diagnosis not present

## 2022-10-30 DIAGNOSIS — Z6832 Body mass index (BMI) 32.0-32.9, adult: Secondary | ICD-10-CM | POA: Diagnosis not present

## 2022-11-17 DIAGNOSIS — L570 Actinic keratosis: Secondary | ICD-10-CM | POA: Diagnosis not present

## 2022-11-17 DIAGNOSIS — L821 Other seborrheic keratosis: Secondary | ICD-10-CM | POA: Diagnosis not present

## 2022-11-17 DIAGNOSIS — L820 Inflamed seborrheic keratosis: Secondary | ICD-10-CM | POA: Diagnosis not present

## 2022-11-17 DIAGNOSIS — L814 Other melanin hyperpigmentation: Secondary | ICD-10-CM | POA: Diagnosis not present

## 2022-11-17 DIAGNOSIS — D1801 Hemangioma of skin and subcutaneous tissue: Secondary | ICD-10-CM | POA: Diagnosis not present

## 2022-11-18 ENCOUNTER — Other Ambulatory Visit: Payer: Self-pay | Admitting: Family Medicine

## 2022-11-18 DIAGNOSIS — Z1231 Encounter for screening mammogram for malignant neoplasm of breast: Secondary | ICD-10-CM

## 2022-11-24 ENCOUNTER — Telehealth: Payer: Self-pay | Admitting: *Deleted

## 2022-11-24 NOTE — Telephone Encounter (Signed)
Ortho bundle call completed. 

## 2022-11-25 DIAGNOSIS — Z6832 Body mass index (BMI) 32.0-32.9, adult: Secondary | ICD-10-CM | POA: Diagnosis not present

## 2022-11-25 DIAGNOSIS — I10 Essential (primary) hypertension: Secondary | ICD-10-CM | POA: Diagnosis not present

## 2022-11-25 DIAGNOSIS — N390 Urinary tract infection, site not specified: Secondary | ICD-10-CM | POA: Diagnosis not present

## 2022-12-10 DIAGNOSIS — K219 Gastro-esophageal reflux disease without esophagitis: Secondary | ICD-10-CM | POA: Diagnosis not present

## 2022-12-10 DIAGNOSIS — Z6831 Body mass index (BMI) 31.0-31.9, adult: Secondary | ICD-10-CM | POA: Diagnosis not present

## 2022-12-15 ENCOUNTER — Ambulatory Visit: Payer: PPO | Admitting: Orthopaedic Surgery

## 2022-12-17 ENCOUNTER — Ambulatory Visit (INDEPENDENT_AMBULATORY_CARE_PROVIDER_SITE_OTHER): Payer: PPO | Admitting: Orthopaedic Surgery

## 2022-12-17 ENCOUNTER — Encounter: Payer: Self-pay | Admitting: Orthopaedic Surgery

## 2022-12-17 ENCOUNTER — Ambulatory Visit (INDEPENDENT_AMBULATORY_CARE_PROVIDER_SITE_OTHER): Payer: PPO

## 2022-12-17 DIAGNOSIS — Z96652 Presence of left artificial knee joint: Secondary | ICD-10-CM

## 2022-12-17 MED ORDER — AMOXICILLIN 500 MG PO CAPS: ORAL_CAPSULE | ORAL | 2 refills | Status: DC

## 2022-12-17 NOTE — Progress Notes (Signed)
Post-Op Visit Note   Patient: Kristy Henry           Date of Birth: Sep 14, 1951           MRN: [id number] Visit Date: 12/17/2022 PCP: Kathyrn Lass, MD   Assessment & Plan:  Chief Complaint:  Chief Complaint  Patient presents with   Left Knee - Follow-up    Left total knee arthroplasty 06/29/2022   Visit Diagnoses:  1. Status post total left knee replacement     Plan: Patient is a pleasant 72 year old female who comes in today 41-monthstatus post left total knee replacement 06/29/2022.  She has been doing well in regards to pain with the exception of some soreness to her thighs when she goes from seated to standing position as well as the day after she works out at [employer]  Her main complaint is the clicking for which she does not like the sound.  Examination of her left knee reveals range of motion from 0 to 115 degrees.  She is stable to valgus and varus stress.  She is neurovascularly intact distally.  At this point, recommended that she may back off a little activity at the gym to see if this will help with her discomfort the day after.  Have also recommended maybe taking ibuprofen or 2 to help with the pain.  She will continue working on strengthening exercises.  Dental prophylaxis reinforced.  Follow-up in 6 months for repeat evaluation and 2 view x-rays of the left knee.  Call with concerns or questions.  Follow-Up Instructions: Return in about 6 months (around 06/19/2023).   Orders:  Orders Placed This Encounter  Procedures   XR Knee 1-2 Views Left   Meds ordered this encounter  Medications   amoxicillin (AMOXIL) 500 MG capsule    Sig: Take four pills one hour prior to dental work    Dispense:  8 capsule    Refill:  2    Imaging: Well-seated prosthesis without complication  PMFS History: Patient Active Problem List   Diagnosis Date Noted   Status post total left knee replacement 06/29/2022   Primary osteoarthritis of left knee 06/03/2022   Non-restorative  sleep 12/12/2018   Nocturia more than twice per night 12/12/2018   Snorings 12/12/2018   Menopausal hot flushes 12/12/2018   Excessive daytime sleepiness 12/12/2018   Past Medical History:  Diagnosis Date   Anxiety    Arthritis    Depression    High cholesterol    HLD (hyperlipidemia)    Hypertension    Pneumonia    Primary sleep disturbance    Syncope and collapse 2019   Vision abnormalities     Family History  Problem Relation Age of Onset   Heart disease Mother    Cancer Father    Colon cancer Father    Breast cancer Sister [phone]  High blood pressure Sister    Diabetes type II Brother    Diabetes type II Brother     Past Surgical History:  Procedure Laterality Date   ABDOMINAL HYSTERECTOMY     BONE EXCISION     from bilateral hands   BREAST BIOPSY Right 09/25/2016   eye lid surgery     RECTOCELE REPAIR     TOTAL KNEE ARTHROPLASTY Left 06/29/2022   Procedure: LEFT TOTAL KNEE ARTHROPLASTY;  Surgeon: XLeandrew Koyanagi MD;  [place] [facility]  Service: Orthopedics;  Laterality: Left;   Social History   Occupational History   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

## 2023-01-01 DIAGNOSIS — N390 Urinary tract infection, site not specified: Secondary | ICD-10-CM | POA: Diagnosis not present

## 2023-01-07 ENCOUNTER — Ambulatory Visit
Admission: RE | Admit: 2023-01-07 | Discharge: 2023-01-07 | Disposition: A | Discharge status: Home / Self Care | Payer: PPO | Source: Ambulatory Visit | Attending: Family Medicine | Admitting: Family Medicine

## 2023-01-07 DIAGNOSIS — Z1231 Encounter for screening mammogram for malignant neoplasm of breast: Secondary | ICD-10-CM | POA: Diagnosis not present

## 2023-02-15 DIAGNOSIS — Z789 Other specified health status: Secondary | ICD-10-CM | POA: Diagnosis not present

## 2023-02-15 DIAGNOSIS — K146 Glossodynia: Secondary | ICD-10-CM | POA: Diagnosis not present

## 2023-02-15 DIAGNOSIS — Z6830 Body mass index (BMI) 30.0-30.9, adult: Secondary | ICD-10-CM | POA: Diagnosis not present

## 2023-02-15 DIAGNOSIS — E669 Obesity, unspecified: Secondary | ICD-10-CM | POA: Diagnosis not present

## 2023-02-15 DIAGNOSIS — K219 Gastro-esophageal reflux disease without esophagitis: Secondary | ICD-10-CM | POA: Diagnosis not present

## 2023-03-12 ENCOUNTER — Encounter: Payer: Self-pay | Admitting: Orthopaedic Surgery

## 2023-03-12 DIAGNOSIS — J069 Acute upper respiratory infection, unspecified: Secondary | ICD-10-CM | POA: Diagnosis not present

## 2023-03-12 NOTE — Telephone Encounter (Signed)
I would take just to be safe

## 2023-03-31 DIAGNOSIS — R300 Dysuria: Secondary | ICD-10-CM | POA: Diagnosis not present

## 2023-05-12 DIAGNOSIS — Z6830 Body mass index (BMI) 30.0-30.9, adult: Secondary | ICD-10-CM | POA: Diagnosis not present

## 2023-05-12 DIAGNOSIS — K30 Functional dyspepsia: Secondary | ICD-10-CM | POA: Diagnosis not present

## 2023-06-01 DIAGNOSIS — R053 Chronic cough: Secondary | ICD-10-CM | POA: Diagnosis not present

## 2023-06-01 DIAGNOSIS — K146 Glossodynia: Secondary | ICD-10-CM | POA: Diagnosis not present

## 2023-06-01 DIAGNOSIS — M8580 Other specified disorders of bone density and structure, unspecified site: Secondary | ICD-10-CM | POA: Diagnosis not present

## 2023-06-01 DIAGNOSIS — K30 Functional dyspepsia: Secondary | ICD-10-CM | POA: Diagnosis not present

## 2023-06-14 DIAGNOSIS — L255 Unspecified contact dermatitis due to plants, except food: Secondary | ICD-10-CM | POA: Diagnosis not present

## 2023-06-30 ENCOUNTER — Telehealth: Payer: Self-pay | Admitting: *Deleted

## 2023-06-30 ENCOUNTER — Ambulatory Visit: Payer: PPO | Admitting: Orthopaedic Surgery

## 2023-06-30 ENCOUNTER — Other Ambulatory Visit (INDEPENDENT_AMBULATORY_CARE_PROVIDER_SITE_OTHER): Payer: PPO

## 2023-06-30 DIAGNOSIS — Z96652 Presence of left artificial knee joint: Secondary | ICD-10-CM

## 2023-06-30 MED ORDER — AMOXICILLIN 500 MG PO CAPS: ORAL_CAPSULE | ORAL | 2 refills | Status: AC

## 2023-06-30 NOTE — Progress Notes (Signed)
Post-Op Visit Note   Patient: Kristy Henry           Date of Birth: 03-13-1951           MRN: [id number] Visit Date: 06/30/2023 PCP: Sigmund Hazel, MD   Assessment & Plan:  Chief Complaint:  Chief Complaint  Patient presents with   Left Knee - Pain   Visit Diagnoses:  1. Status post total left knee replacement     Plan: Patient is a pleasant 72 year old female who comes in today 1 year status post left total knee replacement 06/29/2022.  She has been doing great.  No complaints of pain.    Examination of the left knee reveals range of motion from 0 to 125 degrees.  She is stable valgus varus stress.  She is neurovascularly intact distally.    At this point, she will continue to advance with activity as tolerated.  Dental prophylaxis reinforced for another year.  Follow-up in 1 year for repeat evaluation and 2 view x-rays of the left knee.  Call with concerns or questions.  Follow-Up Instructions: Return in about 1 year (around 06/29/2024).   Orders:  Orders Placed This Encounter  Procedures   XR Knee 1-2 Views Left   Meds ordered this encounter  Medications   amoxicillin (AMOXIL) 500 MG capsule    Sig: Take 4 pills one hour prior to dental work/procedure    Dispense:  12 capsule    Refill:  2    Imaging: XR Knee 1-2 Views Left  Result Date: 06/30/2023 X-rays of the left knee show a stable left total knee replacement in good alignment.    PMFS History: Patient Active Problem List   Diagnosis Date Noted   Status post total left knee replacement 06/29/2022   Primary osteoarthritis of left knee 06/03/2022   Non-restorative sleep 12/12/2018   Nocturia more than twice per night 12/12/2018   Snorings 12/12/2018   Menopausal hot flushes 12/12/2018   Excessive daytime sleepiness 12/12/2018   Past Medical History:  Diagnosis Date   Anxiety    Arthritis    Depression    High cholesterol    HLD (hyperlipidemia)    Hypertension    Pneumonia    Primary sleep  disturbance    Syncope and collapse 2019   Vision abnormalities     Family History  Problem Relation Age of Onset   Heart disease Mother    Cancer Father    Colon cancer Father    Breast cancer Sister 7   High blood pressure Sister    Diabetes type II Brother    Diabetes type II Brother     Past Surgical History:  Procedure Laterality Date   ABDOMINAL HYSTERECTOMY     BONE EXCISION     from bilateral hands   BREAST BIOPSY Right 09/25/2016   eye lid surgery     RECTOCELE REPAIR     TOTAL KNEE ARTHROPLASTY Left 06/29/2022   Procedure: LEFT TOTAL KNEE ARTHROPLASTY;  Surgeon: Tarry Kos, MD;  Location: [facility];  Service: Orthopedics;  Laterality: Left;   Social History   Occupational History   Occupation: Retired Scientist, physiological    Comment: Set designer  Tobacco Use   Smoking status: Former   Smokeless tobacco: Never  Advertising account planner   Vaping status: Never Used  Substance and Sexual Activity   Alcohol use: Yes    Comment: rare glass of wine   Drug use: Never   Sexual activity: Not on file

## 2023-06-30 NOTE — Telephone Encounter (Signed)
1 year in office Ortho bundle visit with Kristy Henry. Survey completed.

## 2023-07-08 DIAGNOSIS — K219 Gastro-esophageal reflux disease without esophagitis: Secondary | ICD-10-CM | POA: Diagnosis not present

## 2023-07-10 DIAGNOSIS — L255 Unspecified contact dermatitis due to plants, except food: Secondary | ICD-10-CM | POA: Diagnosis not present

## 2023-07-14 DIAGNOSIS — L255 Unspecified contact dermatitis due to plants, except food: Secondary | ICD-10-CM | POA: Diagnosis not present

## 2023-08-03 DIAGNOSIS — E559 Vitamin D deficiency, unspecified: Secondary | ICD-10-CM | POA: Diagnosis not present

## 2023-08-03 DIAGNOSIS — E663 Overweight: Secondary | ICD-10-CM | POA: Diagnosis not present

## 2023-08-03 DIAGNOSIS — Z6829 Body mass index (BMI) 29.0-29.9, adult: Secondary | ICD-10-CM | POA: Diagnosis not present

## 2023-08-03 DIAGNOSIS — E7800 Pure hypercholesterolemia, unspecified: Secondary | ICD-10-CM | POA: Diagnosis not present

## 2023-08-03 DIAGNOSIS — K219 Gastro-esophageal reflux disease without esophagitis: Secondary | ICD-10-CM | POA: Diagnosis not present

## 2023-08-03 DIAGNOSIS — Z79899 Other long term (current) drug therapy: Secondary | ICD-10-CM | POA: Diagnosis not present

## 2023-08-03 DIAGNOSIS — I10 Essential (primary) hypertension: Secondary | ICD-10-CM | POA: Diagnosis not present

## 2023-08-03 DIAGNOSIS — Z23 Encounter for immunization: Secondary | ICD-10-CM | POA: Diagnosis not present

## 2023-08-03 DIAGNOSIS — K146 Glossodynia: Secondary | ICD-10-CM | POA: Diagnosis not present

## 2023-08-24 DIAGNOSIS — Z0000 Encounter for general adult medical examination without abnormal findings: Secondary | ICD-10-CM | POA: Diagnosis not present

## 2023-08-24 DIAGNOSIS — Z23 Encounter for immunization: Secondary | ICD-10-CM | POA: Diagnosis not present

## 2023-08-24 DIAGNOSIS — Z1331 Encounter for screening for depression: Secondary | ICD-10-CM | POA: Diagnosis not present

## 2023-08-24 DIAGNOSIS — Z6829 Body mass index (BMI) 29.0-29.9, adult: Secondary | ICD-10-CM | POA: Diagnosis not present

## 2023-11-04 ENCOUNTER — Other Ambulatory Visit: Payer: Self-pay | Admitting: Family Medicine

## 2023-11-04 DIAGNOSIS — Z1231 Encounter for screening mammogram for malignant neoplasm of breast: Secondary | ICD-10-CM

## 2023-11-04 DIAGNOSIS — Z13820 Encounter for screening for osteoporosis: Secondary | ICD-10-CM

## 2023-12-07 DIAGNOSIS — N390 Urinary tract infection, site not specified: Secondary | ICD-10-CM | POA: Diagnosis not present

## 2023-12-28 DIAGNOSIS — S39012A Strain of muscle, fascia and tendon of lower back, initial encounter: Secondary | ICD-10-CM | POA: Diagnosis not present

## 2023-12-28 DIAGNOSIS — M25552 Pain in left hip: Secondary | ICD-10-CM | POA: Diagnosis not present

## 2023-12-30 ENCOUNTER — Other Ambulatory Visit: Payer: Self-pay

## 2023-12-30 ENCOUNTER — Telehealth: Payer: Self-pay | Admitting: Orthopaedic Surgery

## 2023-12-30 MED ORDER — AMOXICILLIN 500 MG PO CAPS: ORAL_CAPSULE | ORAL | 2 refills | Status: AC

## 2023-12-30 NOTE — Telephone Encounter (Signed)
 Notified patient.

## 2023-12-30 NOTE — Telephone Encounter (Signed)
 Pt called requesting antibiotics for upcoming appts. Please send to [facility] on [address]. Pt states she need for dental appt and colonoscopy. Pt phone number is [phone].

## 2024-01-10 ENCOUNTER — Ambulatory Visit
Admission: RE | Admit: 2024-01-10 | Discharge: 2024-01-10 | Disposition: A | Discharge status: Home / Self Care | Payer: PPO | Source: Ambulatory Visit | Attending: Family Medicine | Admitting: Family Medicine

## 2024-01-10 DIAGNOSIS — Z1231 Encounter for screening mammogram for malignant neoplasm of breast: Secondary | ICD-10-CM | POA: Diagnosis not present

## 2024-01-18 DIAGNOSIS — E7800 Pure hypercholesterolemia, unspecified: Secondary | ICD-10-CM | POA: Diagnosis not present

## 2024-01-18 DIAGNOSIS — K146 Glossodynia: Secondary | ICD-10-CM | POA: Diagnosis not present

## 2024-01-18 DIAGNOSIS — N390 Urinary tract infection, site not specified: Secondary | ICD-10-CM | POA: Diagnosis not present

## 2024-01-18 DIAGNOSIS — I10 Essential (primary) hypertension: Secondary | ICD-10-CM | POA: Diagnosis not present

## 2024-02-09 DIAGNOSIS — L237 Allergic contact dermatitis due to plants, except food: Secondary | ICD-10-CM | POA: Diagnosis not present

## 2024-02-16 DIAGNOSIS — L237 Allergic contact dermatitis due to plants, except food: Secondary | ICD-10-CM | POA: Diagnosis not present

## 2024-02-24 DIAGNOSIS — L239 Allergic contact dermatitis, unspecified cause: Secondary | ICD-10-CM | POA: Diagnosis not present

## 2024-03-09 DIAGNOSIS — L309 Dermatitis, unspecified: Secondary | ICD-10-CM | POA: Diagnosis not present

## 2024-03-09 DIAGNOSIS — L821 Other seborrheic keratosis: Secondary | ICD-10-CM | POA: Diagnosis not present

## 2024-03-09 DIAGNOSIS — D485 Neoplasm of uncertain behavior of skin: Secondary | ICD-10-CM | POA: Diagnosis not present

## 2024-03-09 DIAGNOSIS — L720 Epidermal cyst: Secondary | ICD-10-CM | POA: Diagnosis not present

## 2024-03-09 DIAGNOSIS — D1801 Hemangioma of skin and subcutaneous tissue: Secondary | ICD-10-CM | POA: Diagnosis not present

## 2024-03-09 DIAGNOSIS — L814 Other melanin hyperpigmentation: Secondary | ICD-10-CM | POA: Diagnosis not present

## 2024-04-01 DIAGNOSIS — L255 Unspecified contact dermatitis due to plants, except food: Secondary | ICD-10-CM | POA: Diagnosis not present

## 2024-04-24 DIAGNOSIS — L237 Allergic contact dermatitis due to plants, except food: Secondary | ICD-10-CM | POA: Diagnosis not present

## 2024-05-08 DIAGNOSIS — L255 Unspecified contact dermatitis due to plants, except food: Secondary | ICD-10-CM | POA: Diagnosis not present

## 2024-05-08 DIAGNOSIS — N3001 Acute cystitis with hematuria: Secondary | ICD-10-CM | POA: Diagnosis not present

## 2024-05-08 DIAGNOSIS — R300 Dysuria: Secondary | ICD-10-CM | POA: Diagnosis not present

## 2024-05-16 ENCOUNTER — Ambulatory Visit (HOSPITAL_BASED_OUTPATIENT_CLINIC_OR_DEPARTMENT_OTHER)
Admission: RE | Admit: 2024-05-16 | Discharge: 2024-05-16 | Disposition: A | Discharge status: Home / Self Care | Source: Ambulatory Visit | Attending: Family Medicine | Admitting: Family Medicine

## 2024-05-16 DIAGNOSIS — Z780 Asymptomatic menopausal state: Secondary | ICD-10-CM | POA: Diagnosis not present

## 2024-05-16 DIAGNOSIS — M8589 Other specified disorders of bone density and structure, multiple sites: Secondary | ICD-10-CM | POA: Diagnosis not present

## 2024-05-16 DIAGNOSIS — Z13820 Encounter for screening for osteoporosis: Secondary | ICD-10-CM | POA: Diagnosis not present

## 2024-06-13 DIAGNOSIS — R300 Dysuria: Secondary | ICD-10-CM | POA: Diagnosis not present

## 2024-06-13 DIAGNOSIS — N3001 Acute cystitis with hematuria: Secondary | ICD-10-CM | POA: Diagnosis not present

## 2024-06-21 ENCOUNTER — Other Ambulatory Visit: Payer: PPO

## 2024-06-29 ENCOUNTER — Ambulatory Visit: Payer: PPO | Admitting: Orthopaedic Surgery

## 2024-06-29 DIAGNOSIS — R399 Unspecified symptoms and signs involving the genitourinary system: Secondary | ICD-10-CM | POA: Diagnosis not present

## 2024-06-29 DIAGNOSIS — R102 Pelvic and perineal pain: Secondary | ICD-10-CM | POA: Diagnosis not present

## 2024-07-28 DIAGNOSIS — R300 Dysuria: Secondary | ICD-10-CM | POA: Diagnosis not present

## 2024-07-28 DIAGNOSIS — R1024 Suprapubic pain: Secondary | ICD-10-CM | POA: Diagnosis not present

## 2024-07-28 DIAGNOSIS — N3000 Acute cystitis without hematuria: Secondary | ICD-10-CM | POA: Diagnosis not present

## 2024-08-14 ENCOUNTER — Encounter: Payer: Self-pay | Admitting: Radiology

## 2024-08-16 DIAGNOSIS — K146 Glossodynia: Secondary | ICD-10-CM | POA: Diagnosis not present

## 2024-08-16 DIAGNOSIS — E559 Vitamin D deficiency, unspecified: Secondary | ICD-10-CM | POA: Diagnosis not present

## 2024-08-16 DIAGNOSIS — M8580 Other specified disorders of bone density and structure, unspecified site: Secondary | ICD-10-CM | POA: Diagnosis not present

## 2024-08-16 DIAGNOSIS — M7918 Myalgia, other site: Secondary | ICD-10-CM | POA: Diagnosis not present

## 2024-08-16 DIAGNOSIS — I10 Essential (primary) hypertension: Secondary | ICD-10-CM | POA: Diagnosis not present

## 2024-08-16 DIAGNOSIS — E7800 Pure hypercholesterolemia, unspecified: Secondary | ICD-10-CM | POA: Diagnosis not present

## 2024-08-16 DIAGNOSIS — N390 Urinary tract infection, site not specified: Secondary | ICD-10-CM | POA: Diagnosis not present

## 2024-08-18 DIAGNOSIS — E559 Vitamin D deficiency, unspecified: Secondary | ICD-10-CM | POA: Diagnosis not present

## 2024-08-18 DIAGNOSIS — E7800 Pure hypercholesterolemia, unspecified: Secondary | ICD-10-CM | POA: Diagnosis not present

## 2024-08-18 DIAGNOSIS — I10 Essential (primary) hypertension: Secondary | ICD-10-CM | POA: Diagnosis not present

## 2024-08-30 ENCOUNTER — Other Ambulatory Visit (HOSPITAL_BASED_OUTPATIENT_CLINIC_OR_DEPARTMENT_OTHER): Payer: Self-pay | Admitting: Family Medicine

## 2024-08-30 DIAGNOSIS — E7800 Pure hypercholesterolemia, unspecified: Secondary | ICD-10-CM

## 2024-09-13 DIAGNOSIS — Z0000 Encounter for general adult medical examination without abnormal findings: Secondary | ICD-10-CM | POA: Diagnosis not present

## 2024-11-20 ENCOUNTER — Other Ambulatory Visit (HOSPITAL_BASED_OUTPATIENT_CLINIC_OR_DEPARTMENT_OTHER)
